# Patient Record
Sex: Female | Born: 1975 | Hispanic: No | Marital: Married | Smoking: Never smoker
Health system: Southern US, Community
[De-identification: ages and names within clinical notes are randomized; demographics above are authoritative.]

## PROBLEM LIST (undated history)

## (undated) DIAGNOSIS — J351 Hypertrophy of tonsils: Secondary | ICD-10-CM

## (undated) DIAGNOSIS — E871 Hypo-osmolality and hyponatremia: Secondary | ICD-10-CM

## (undated) DIAGNOSIS — Z0000 Encounter for general adult medical examination without abnormal findings: Principal | ICD-10-CM

## (undated) DIAGNOSIS — D1730 Benign lipomatous neoplasm of skin and subcutaneous tissue of unspecified sites: Secondary | ICD-10-CM

## (undated) DIAGNOSIS — R739 Hyperglycemia, unspecified: Secondary | ICD-10-CM

## (undated) DIAGNOSIS — D649 Anemia, unspecified: Secondary | ICD-10-CM

## (undated) DIAGNOSIS — B019 Varicella without complication: Secondary | ICD-10-CM

## (undated) DIAGNOSIS — T7840XA Allergy, unspecified, initial encounter: Secondary | ICD-10-CM

## (undated) HISTORY — PX: WISDOM TOOTH EXTRACTION: SHX21

## (undated) HISTORY — DX: Hypo-osmolality and hyponatremia: E87.1

## (undated) HISTORY — DX: Encounter for general adult medical examination without abnormal findings: Z00.00

## (undated) HISTORY — DX: Allergy, unspecified, initial encounter: T78.40XA

## (undated) HISTORY — DX: Hyperglycemia, unspecified: R73.9

## (undated) HISTORY — DX: Anemia, unspecified: D64.9

## (undated) HISTORY — DX: Benign lipomatous neoplasm of skin and subcutaneous tissue of unspecified sites: D17.30

## (undated) HISTORY — DX: Varicella without complication: B01.9

## (undated) HISTORY — DX: Hypertrophy of tonsils: J35.1

---

## 2003-04-29 ENCOUNTER — Other Ambulatory Visit: Admission: RE | Admit: 2003-04-29 | Discharge: 2003-04-29 | Payer: Self-pay | Admitting: Obstetrics and Gynecology

## 2004-06-07 ENCOUNTER — Other Ambulatory Visit: Admission: RE | Admit: 2004-06-07 | Discharge: 2004-06-07 | Payer: Self-pay | Admitting: Obstetrics and Gynecology

## 2005-07-02 ENCOUNTER — Other Ambulatory Visit: Admission: RE | Admit: 2005-07-02 | Discharge: 2005-07-02 | Payer: Self-pay | Admitting: Obstetrics and Gynecology

## 2010-11-06 ENCOUNTER — Emergency Department (HOSPITAL_COMMUNITY)
Admission: EM | Admit: 2010-11-06 | Discharge: 2010-11-06 | Payer: Self-pay | Source: Home / Self Care | Admitting: Emergency Medicine

## 2011-11-14 IMAGING — CR DG FOOT COMPLETE 3+V*R*
3 series · 3 of 3 positions shown · non-contrast
Comparison: None

CLINICAL DATA: Trauma.  Foreign object within bottom of foot.

RIGHT FOOT COMPLETE - 3+ VIEW

[t foot ap right]
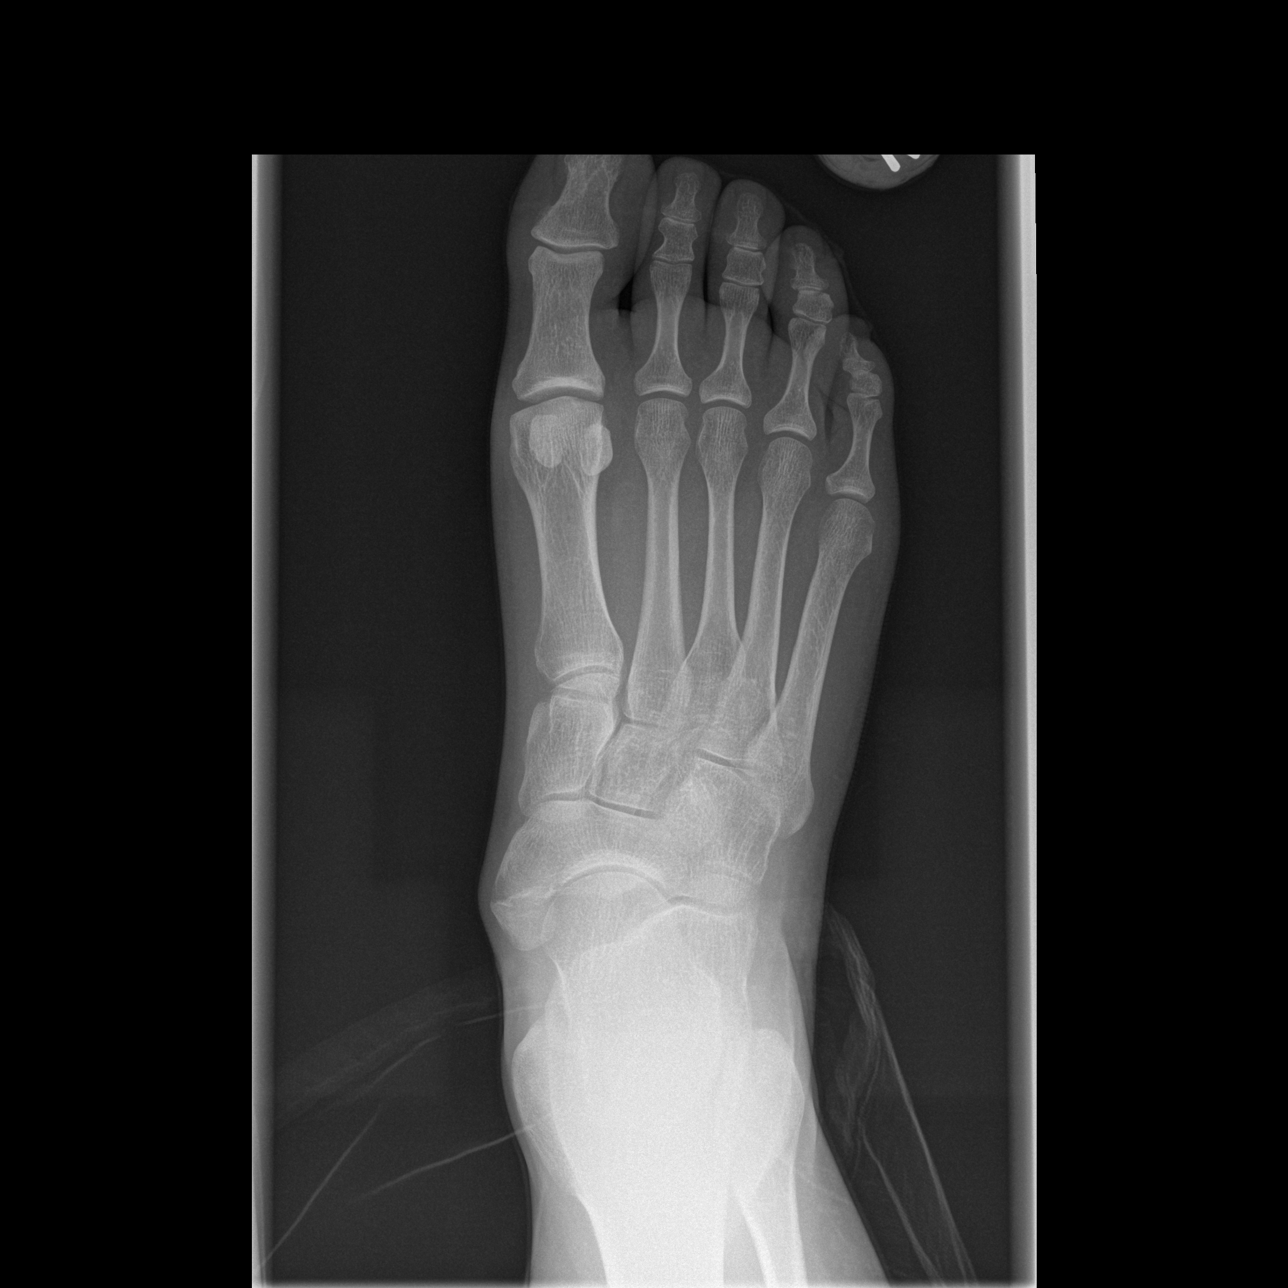

[t foot oblique right]
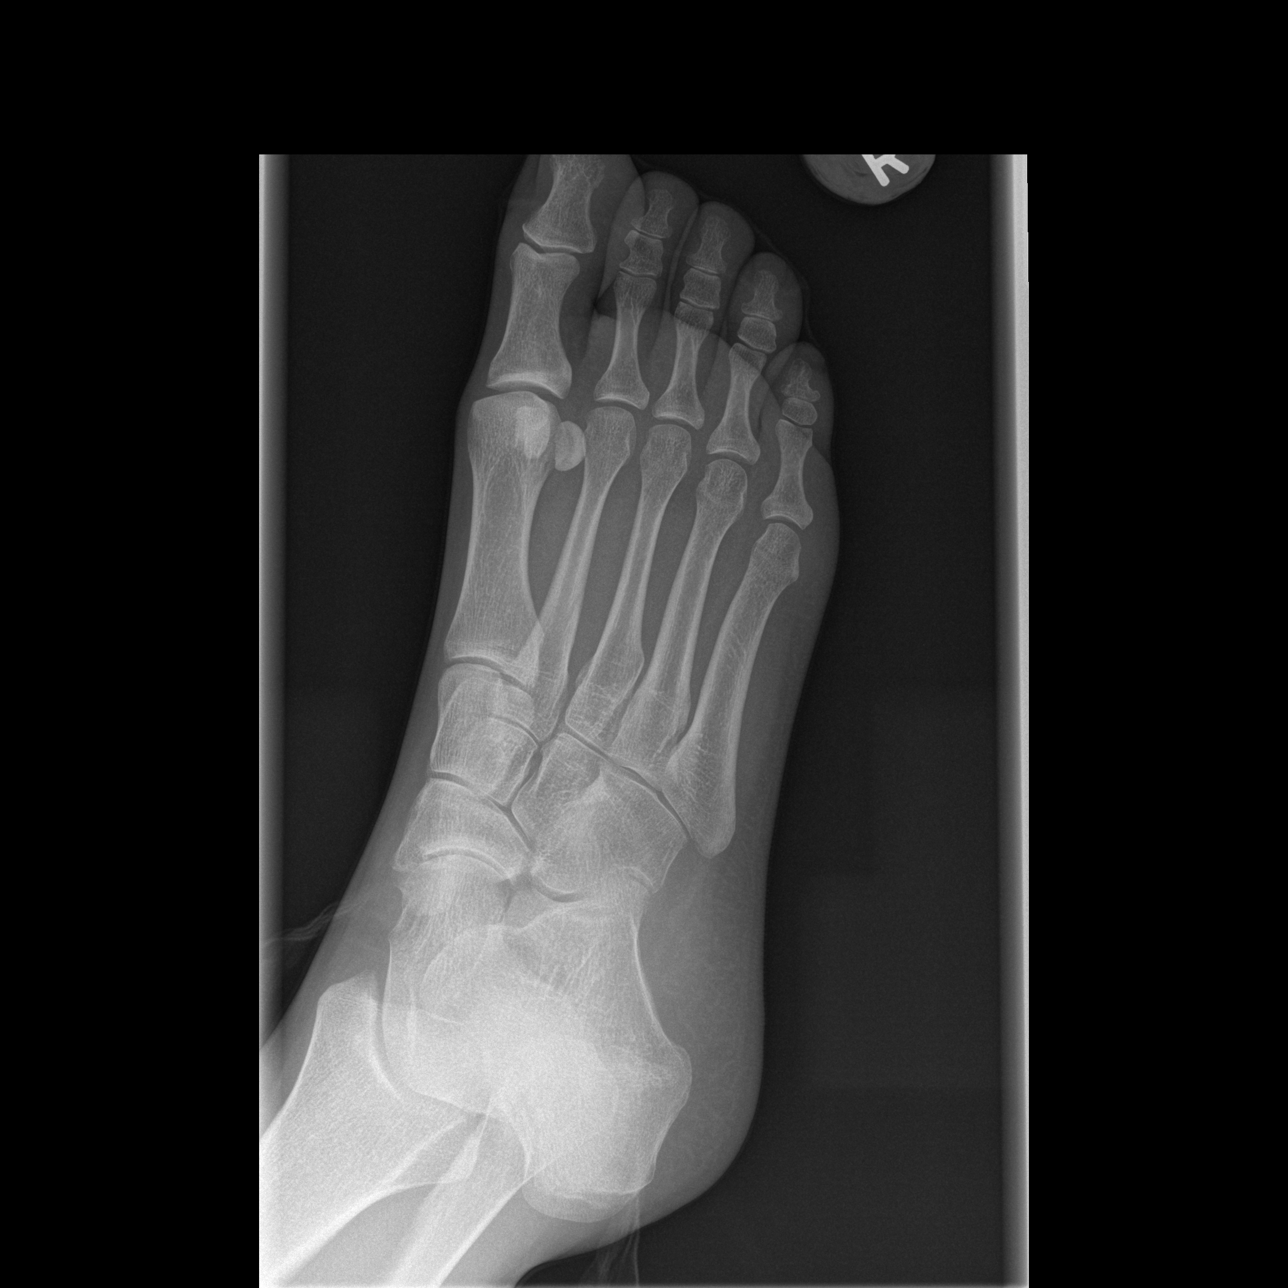

[t foot lat right]
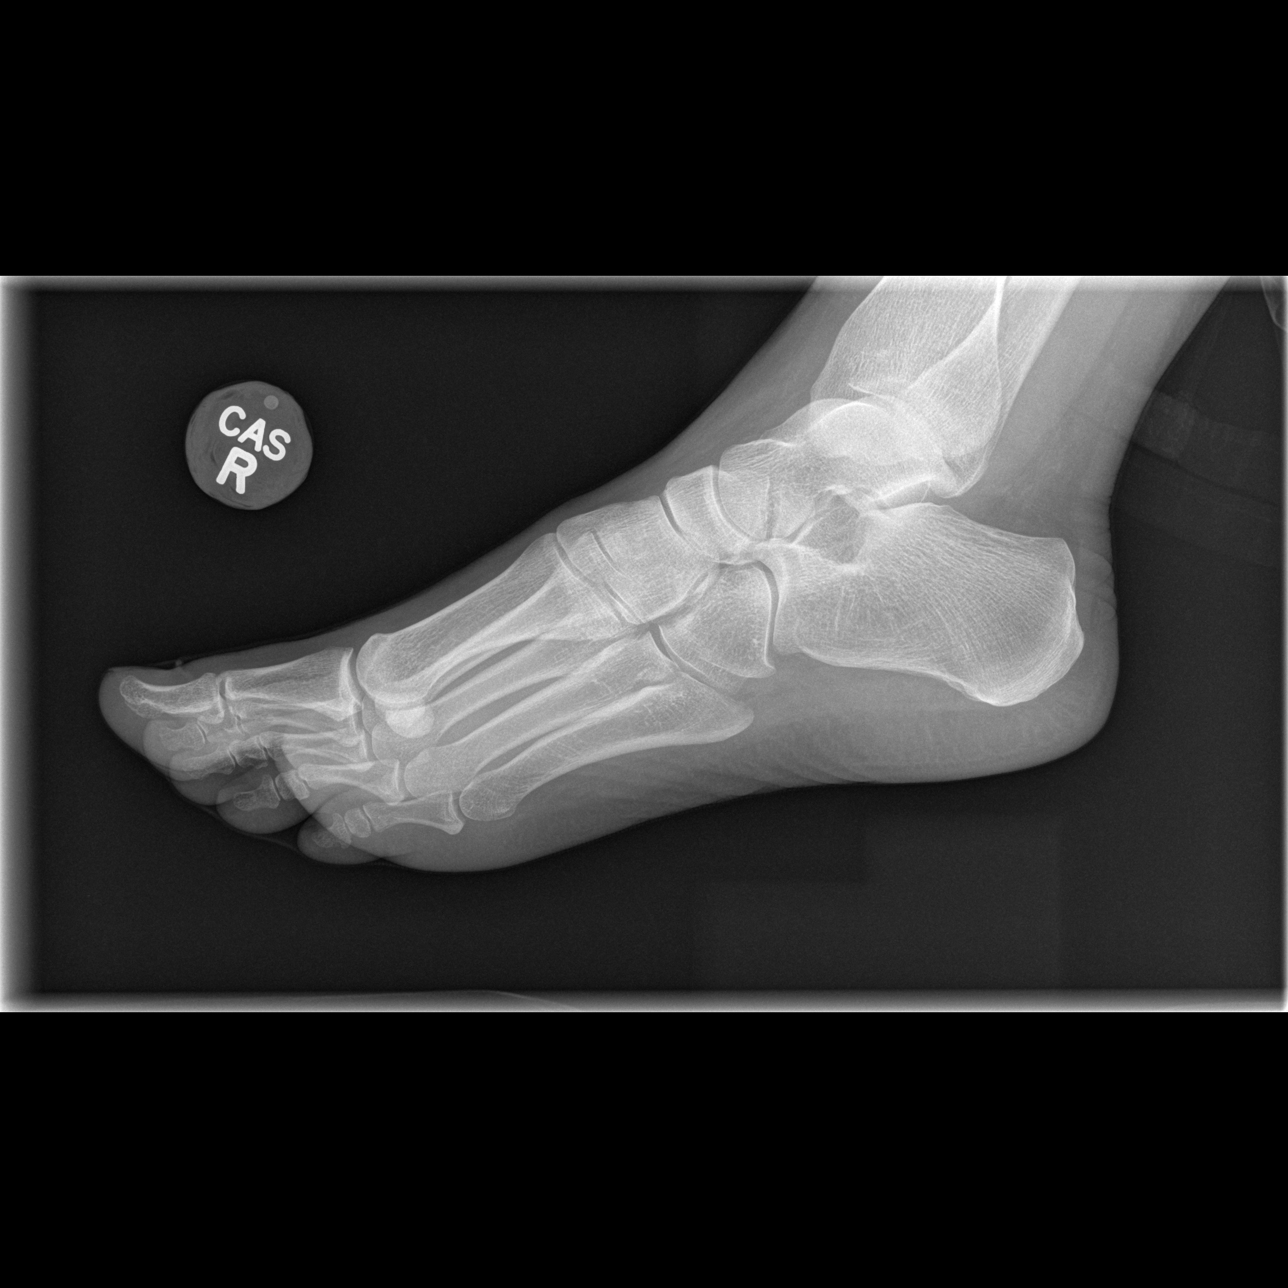

[3 of 3 positions shown; findings below may reference images not displayed]

FINDINGS: There is no evidence of fracture or dislocation.  There is no
evidence of arthropathy or other focal bone abnormality.  Soft
tissues are unremarkable.
IMPRESSION: 1.  No acute findings.
2.  No radiopaque foreign bodies identified.

## 2013-08-17 ENCOUNTER — Other Ambulatory Visit (HOSPITAL_COMMUNITY): Payer: Self-pay

## 2013-08-18 ENCOUNTER — Encounter: Payer: Self-pay | Admitting: Family Medicine

## 2013-08-18 ENCOUNTER — Ambulatory Visit (INDEPENDENT_AMBULATORY_CARE_PROVIDER_SITE_OTHER): Payer: BC Managed Care – PPO | Admitting: Family Medicine

## 2013-08-18 VITALS — BP 118/78 | HR 76 | Temp 99.1°F | Ht 69.0 in | Wt 150.0 lb

## 2013-08-18 DIAGNOSIS — Z23 Encounter for immunization: Secondary | ICD-10-CM

## 2013-08-18 DIAGNOSIS — Z0000 Encounter for general adult medical examination without abnormal findings: Secondary | ICD-10-CM

## 2013-08-18 DIAGNOSIS — J351 Hypertrophy of tonsils: Secondary | ICD-10-CM

## 2013-08-18 DIAGNOSIS — E871 Hypo-osmolality and hyponatremia: Secondary | ICD-10-CM

## 2013-08-18 DIAGNOSIS — T7840XA Allergy, unspecified, initial encounter: Secondary | ICD-10-CM

## 2013-08-18 MED ORDER — FLUTICASONE PROPIONATE 50 MCG/ACT NA SUSP: 1.0000 | Freq: Every day | NASAL | Status: DC

## 2013-08-18 NOTE — Patient Instructions (Addendum)
Preventive Care for Adults, Female A healthy lifestyle and preventive care can promote health and wellness. Preventive health guidelines for women include the following key practices.  A routine yearly physical is a good way to check with your caregiver about your health and preventive screening. It is a chance to share any concerns and updates on your health, and to receive a thorough exam.  Visit your dentist for a routine exam and preventive care every 6 months. Brush your teeth twice a day and floss once a day. Good oral hygiene prevents tooth decay and gum disease.  The frequency of eye exams is based on your age, health, family medical history, use of contact lenses, and other factors. Follow your caregiver's recommendations for frequency of eye exams.  Eat a healthy diet. Foods like vegetables, fruits, whole grains, low-fat dairy products, and lean protein foods contain the nutrients you need without too many calories. Decrease your intake of foods high in solid fats, added sugars, and salt. Eat the right amount of calories for you.Get information about a proper diet from your caregiver, if necessary.  Regular physical exercise is one of the most important things you can do for your health. Most adults should get at least 150 minutes of moderate-intensity exercise (any activity that increases your heart rate and causes you to sweat) each week. In addition, most adults need muscle-strengthening exercises on 2 or more days a week.  Maintain a healthy weight. The body mass index (BMI) is a screening tool to identify possible weight problems. It provides an estimate of body fat based on height and weight. Your caregiver can help determine your BMI, and can help you achieve or maintain a healthy weight.For adults 20 years and older:  A BMI below 18.5 is considered underweight.  A BMI of 18.5 to 24.9 is normal.  A BMI of 25 to 29.9 is considered overweight.  A BMI of 30 and above is  considered obese.  Maintain normal blood lipids and cholesterol levels by exercising and minimizing your intake of saturated fat. Eat a balanced diet with plenty of fruit and vegetables. Blood tests for lipids and cholesterol should begin at age 20 and be repeated every 5 years. If your lipid or cholesterol levels are high, you are over 50, or you are at high risk for heart disease, you may need your cholesterol levels checked more frequently.Ongoing high lipid and cholesterol levels should be treated with medicines if diet and exercise are not effective.  If you smoke, find out from your caregiver how to quit. If you do not use tobacco, do not start.  If you are pregnant, do not drink alcohol. If you are breastfeeding, be very cautious about drinking alcohol. If you are not pregnant and choose to drink alcohol, do not exceed 1 drink per day. One drink is considered to be 12 ounces (355 mL) of beer, 5 ounces (148 mL) of wine, or 1.5 ounces (44 mL) of liquor.  Avoid use of street drugs. Do not share needles with anyone. Ask for help if you need support or instructions about stopping the use of drugs.  High blood pressure causes heart disease and increases the risk of stroke. Your blood pressure should be checked at least every 1 to 2 years. Ongoing high blood pressure should be treated with medicines if weight loss and exercise are not effective.  If you are 55 to 39 years old, ask your caregiver if you should take aspirin to prevent strokes.  Diabetes   screening involves taking a blood sample to check your fasting blood sugar level. This should be done once every 3 years, after age 45, if you are within normal weight and without risk factors for diabetes. Testing should be considered at a younger age or be carried out more frequently if you are overweight and have at least 1 risk factor for diabetes.  Breast cancer screening is essential preventive care for women. You should practice "breast  self-awareness." This means understanding the normal appearance and feel of your breasts and may include breast self-examination. Any changes detected, no matter how small, should be reported to a caregiver. Women in their 20s and 30s should have a clinical breast exam (CBE) by a caregiver as part of a regular health exam every 1 to 3 years. After age 40, women should have a CBE every year. Starting at age 40, women should consider having a mammography (breast X-ray test) every year. Women who have a family history of breast cancer should talk to their caregiver about genetic screening. Women at a high risk of breast cancer should talk to their caregivers about having magnetic resonance imaging (MRI) and a mammography every year.  The Pap test is a screening test for cervical cancer. A Pap test can show cell changes on the cervix that might become cervical cancer if left untreated. A Pap test is a procedure in which cells are obtained and examined from the lower end of the uterus (cervix).  Women should have a Pap test starting at age 21.  Between ages 21 and 29, Pap tests should be repeated every 2 years.  Beginning at age 30, you should have a Pap test every 3 years as long as the past 3 Pap tests have been normal.  Some women have medical problems that increase the chance of getting cervical cancer. Talk to your caregiver about these problems. It is especially important to talk to your caregiver if a new problem develops soon after your last Pap test. In these cases, your caregiver may recommend more frequent screening and Pap tests.  The above recommendations are the same for women who have or have not gotten the vaccine for human papillomavirus (HPV).  If you had a hysterectomy for a problem that was not cancer or a condition that could lead to cancer, then you no longer need Pap tests. Even if you no longer need a Pap test, a regular exam is a good idea to make sure no other problems are  starting.  If you are between ages 65 and 70, and you have had normal Pap tests going back 10 years, you no longer need Pap tests. Even if you no longer need a Pap test, a regular exam is a good idea to make sure no other problems are starting.  If you have had past treatment for cervical cancer or a condition that could lead to cancer, you need Pap tests and screening for cancer for at least 20 years after your treatment.  If Pap tests have been discontinued, risk factors (such as a new sexual partner) need to be reassessed to determine if screening should be resumed.  The HPV test is an additional test that may be used for cervical cancer screening. The HPV test looks for the virus that can cause the cell changes on the cervix. The cells collected during the Pap test can be tested for HPV. The HPV test could be used to screen women aged 30 years and older, and should   be used in women of any age who have unclear Pap test results. After the age of 30, women should have HPV testing at the same frequency as a Pap test.  Colorectal cancer can be detected and often prevented. Most routine colorectal cancer screening begins at the age of 50 and continues through age 75. However, your caregiver may recommend screening at an earlier age if you have risk factors for colon cancer. On a yearly basis, your caregiver may provide home test kits to check for hidden blood in the stool. Use of a small camera at the end of a tube, to directly examine the colon (sigmoidoscopy or colonoscopy), can detect the earliest forms of colorectal cancer. Talk to your caregiver about this at age 50, when routine screening begins. Direct examination of the colon should be repeated every 5 to 10 years through age 75, unless early forms of pre-cancerous polyps or small growths are found.  Hepatitis C blood testing is recommended for all people born from 1945 through 1965 and any individual with known risks for hepatitis C.  Practice  safe sex. Use condoms and avoid high-risk sexual practices to reduce the spread of sexually transmitted infections (STIs). STIs include gonorrhea, chlamydia, syphilis, trichomonas, herpes, HPV, and human immunodeficiency virus (HIV). Herpes, HIV, and HPV are viral illnesses that have no cure. They can result in disability, cancer, and death. Sexually active women aged 25 and younger should be checked for chlamydia. Older women with new or multiple partners should also be tested for chlamydia. Testing for other STIs is recommended if you are sexually active and at increased risk.  Osteoporosis is a disease in which the bones lose minerals and strength with aging. This can result in serious bone fractures. The risk of osteoporosis can be identified using a bone density scan. Women ages 65 and over and women at risk for fractures or osteoporosis should discuss screening with their caregivers. Ask your caregiver whether you should take a calcium supplement or vitamin D to reduce the rate of osteoporosis.  Menopause can be associated with physical symptoms and risks. Hormone replacement therapy is available to decrease symptoms and risks. You should talk to your caregiver about whether hormone replacement therapy is right for you.  Use sunscreen with sun protection factor (SPF) of 30 or more. Apply sunscreen liberally and repeatedly throughout the day. You should seek shade when your shadow is shorter than you. Protect yourself by wearing long sleeves, pants, a wide-brimmed hat, and sunglasses year round, whenever you are outdoors.  Once a month, do a whole body skin exam, using a mirror to look at the skin on your back. Notify your caregiver of new moles, moles that have irregular borders, moles that are larger than a pencil eraser, or moles that have changed in shape or color.  Stay current with required immunizations.  Influenza. You need a dose every fall (or winter). The composition of the flu vaccine  changes each year, so being vaccinated once is not enough.  Pneumococcal polysaccharide. You need 1 to 2 doses if you smoke cigarettes or if you have certain chronic medical conditions. You need 1 dose at age 65 (or older) if you have never been vaccinated.  Tetanus, diphtheria, pertussis (Tdap, Td). Get 1 dose of Tdap vaccine if you are younger than age 65, are over 65 and have contact with an infant, are a healthcare worker, are pregnant, or simply want to be protected from whooping cough. After that, you need a Td   booster dose every 10 years. Consult your caregiver if you have not had at least 3 tetanus and diphtheria-containing shots sometime in your life or have a deep or dirty wound.  HPV. You need this vaccine if you are a woman age 26 or younger. The vaccine is given in 3 doses over 6 months.  Measles, mumps, rubella (MMR). You need at least 1 dose of MMR if you were born in 1957 or later. You may also need a second dose.  Meningococcal. If you are age 19 to 21 and a first-year college student living in a residence hall, or have one of several medical conditions, you need to get vaccinated against meningococcal disease. You may also need additional booster doses.  Zoster (shingles). If you are age 60 or older, you should get this vaccine.  Varicella (chickenpox). If you have never had chickenpox or you were vaccinated but received only 1 dose, talk to your caregiver to find out if you need this vaccine.  Hepatitis A. You need this vaccine if you have a specific risk factor for hepatitis A virus infection or you simply wish to be protected from this disease. The vaccine is usually given as 2 doses, 6 to 18 months apart.  Hepatitis B. You need this vaccine if you have a specific risk factor for hepatitis B virus infection or you simply wish to be protected from this disease. The vaccine is given in 3 doses, usually over 6 months. Preventive Services / Frequency Ages 19 to 39  Blood  pressure check.** / Every 1 to 2 years.  Lipid and cholesterol check.** / Every 5 years beginning at age 20.  Clinical breast exam.** / Every 3 years for women in their 20s and 30s.  Pap test.** / Every 2 years from ages 21 through 29. Every 3 years starting at age 30 through age 65 or 70 with a history of 3 consecutive normal Pap tests.  HPV screening.** / Every 3 years from ages 30 through ages 65 to 70 with a history of 3 consecutive normal Pap tests.  Hepatitis C blood test.** / For any individual with known risks for hepatitis C.  Skin self-exam. / Monthly.  Influenza immunization.** / Every year.  Pneumococcal polysaccharide immunization.** / 1 to 2 doses if you smoke cigarettes or if you have certain chronic medical conditions.  Tetanus, diphtheria, pertussis (Tdap, Td) immunization. / A one-time dose of Tdap vaccine. After that, you need a Td booster dose every 10 years.  HPV immunization. / 3 doses over 6 months, if you are 26 and younger.  Measles, mumps, rubella (MMR) immunization. / You need at least 1 dose of MMR if you were born in 1957 or later. You may also need a second dose.  Meningococcal immunization. / 1 dose if you are age 19 to 21 and a first-year college student living in a residence hall, or have one of several medical conditions, you need to get vaccinated against meningococcal disease. You may also need additional booster doses.  Varicella immunization.** / Consult your caregiver.  Hepatitis A immunization.** / Consult your caregiver. 2 doses, 6 to 18 months apart.  Hepatitis B immunization.** / Consult your caregiver. 3 doses usually over 6 months. Ages 40 to 64  Blood pressure check.** / Every 1 to 2 years.  Lipid and cholesterol check.** / Every 5 years beginning at age 20.  Clinical breast exam.** / Every year after age 40.  Mammogram.** / Every year beginning at age 40   and continuing for as long as you are in good health. Consult with your  caregiver.  Pap test.** / Every 3 years starting at age 30 through age 65 or 70 with a history of 3 consecutive normal Pap tests.  HPV screening.** / Every 3 years from ages 30 through ages 65 to 70 with a history of 3 consecutive normal Pap tests.  Fecal occult blood test (FOBT) of stool. / Every year beginning at age 50 and continuing until age 75. You may not need to do this test if you get a colonoscopy every 10 years.  Flexible sigmoidoscopy or colonoscopy.** / Every 5 years for a flexible sigmoidoscopy or every 10 years for a colonoscopy beginning at age 50 and continuing until age 75.  Hepatitis C blood test.** / For all people born from 1945 through 1965 and any individual with known risks for hepatitis C.  Skin self-exam. / Monthly.  Influenza immunization.** / Every year.  Pneumococcal polysaccharide immunization.** / 1 to 2 doses if you smoke cigarettes or if you have certain chronic medical conditions.  Tetanus, diphtheria, pertussis (Tdap, Td) immunization.** / A one-time dose of Tdap vaccine. After that, you need a Td booster dose every 10 years.  Measles, mumps, rubella (MMR) immunization. / You need at least 1 dose of MMR if you were born in 1957 or later. You may also need a second dose.  Varicella immunization.** / Consult your caregiver.  Meningococcal immunization.** / Consult your caregiver.  Hepatitis A immunization.** / Consult your caregiver. 2 doses, 6 to 18 months apart.  Hepatitis B immunization.** / Consult your caregiver. 3 doses, usually over 6 months. Ages 65 and over  Blood pressure check.** / Every 1 to 2 years.  Lipid and cholesterol check.** / Every 5 years beginning at age 20.  Clinical breast exam.** / Every year after age 40.  Mammogram.** / Every year beginning at age 40 and continuing for as long as you are in good health. Consult with your caregiver.  Pap test.** / Every 3 years starting at age 30 through age 65 or 70 with a 3  consecutive normal Pap tests. Testing can be stopped between 65 and 70 with 3 consecutive normal Pap tests and no abnormal Pap or HPV tests in the past 10 years.  HPV screening.** / Every 3 years from ages 30 through ages 65 or 70 with a history of 3 consecutive normal Pap tests. Testing can be stopped between 65 and 70 with 3 consecutive normal Pap tests and no abnormal Pap or HPV tests in the past 10 years.  Fecal occult blood test (FOBT) of stool. / Every year beginning at age 50 and continuing until age 75. You may not need to do this test if you get a colonoscopy every 10 years.  Flexible sigmoidoscopy or colonoscopy.** / Every 5 years for a flexible sigmoidoscopy or every 10 years for a colonoscopy beginning at age 50 and continuing until age 75.  Hepatitis C blood test.** / For all people born from 1945 through 1965 and any individual with known risks for hepatitis C.  Osteoporosis screening.** / A one-time screening for women ages 65 and over and women at risk for fractures or osteoporosis.  Skin self-exam. / Monthly.  Influenza immunization.** / Every year.  Pneumococcal polysaccharide immunization.** / 1 dose at age 65 (or older) if you have never been vaccinated.  Tetanus, diphtheria, pertussis (Tdap, Td) immunization. / A one-time dose of Tdap vaccine if you are over   65 and have contact with an infant, are a healthcare worker, or simply want to be protected from whooping cough. After that, you need a Td booster dose every 10 years.  Varicella immunization.** / Consult your caregiver.  Meningococcal immunization.** / Consult your caregiver.  Hepatitis A immunization.** / Consult your caregiver. 2 doses, 6 to 18 months apart.  Hepatitis B immunization.** / Check with your caregiver. 3 doses, usually over 6 months. ** Family history and personal history of risk and conditions may change your caregiver's recommendations. Document Released: 01/07/2002 Document Revised: 02/03/2012  Document Reviewed: 04/08/2011 [employer] Patient Information 2014 [employer].  

## 2013-08-20 LAB — RENAL FUNCTION PANEL
BUN: 12 mg/dL (ref 6–23)
CO2: 26 mEq/L (ref 19–32)
Chloride: 102 mEq/L (ref 96–112)
Glucose, Bld: 107 mg/dL — ABNORMAL HIGH (ref 70–99)
Potassium: 4.3 mEq/L (ref 3.5–5.3)

## 2013-08-20 LAB — HEPATIC FUNCTION PANEL
ALT: 10 U/L (ref 0–35)
AST: 15 U/L (ref 0–37)
Alkaline Phosphatase: 37 U/L — ABNORMAL LOW (ref 39–117)
Bilirubin, Direct: 0.1 mg/dL (ref 0.0–0.3)
Indirect Bilirubin: 0.2 mg/dL (ref 0.0–0.9)

## 2013-08-20 LAB — LIPID PANEL
LDL Cholesterol: 89 mg/dL (ref 0–99)
Triglycerides: 96 mg/dL (ref <150)
VLDL: 19 mg/dL (ref 0–40)

## 2013-08-22 ENCOUNTER — Encounter: Payer: Self-pay | Admitting: Family Medicine

## 2013-08-22 DIAGNOSIS — J351 Hypertrophy of tonsils: Secondary | ICD-10-CM

## 2013-08-22 DIAGNOSIS — E871 Hypo-osmolality and hyponatremia: Secondary | ICD-10-CM | POA: Insufficient documentation

## 2013-08-22 DIAGNOSIS — Z0000 Encounter for general adult medical examination without abnormal findings: Secondary | ICD-10-CM

## 2013-08-22 HISTORY — DX: Encounter for general adult medical examination without abnormal findings: Z00.00

## 2013-08-22 HISTORY — DX: Hypo-osmolality and hyponatremia: E87.1

## 2013-08-22 HISTORY — DX: Hypertrophy of tonsils: J35.1

## 2013-08-22 NOTE — Assessment & Plan Note (Signed)
Give flu shot today. Annual labs drawn today. Encouraged DASH diet and regular exercise.

## 2013-08-22 NOTE — Progress Notes (Signed)
Patient ID: Faythe Casa, female   DOB: 01-20-1976, 37 y.o.   MRN: [id number] ABIGALE DOROW [id number] [birth year] 08/22/2013      Progress Note-Follow Up  Subjective  Chief Complaint  Chief Complaint  Patient presents with  . Establish Care    new patient  . Injections    flu    HPI  Patient is a 37 year old Caucasian female who is here today to establish care. She has not had a primary care doctor in many years. Has followed with [facility] Dr. Tenny Craw for many years. Generally is in good health but does offer  A fe edema in her legs after being on her feet for long time. Some allergies her mildly controlled on antihistamines but she still having breakthrough symptoms. Finally she has a long history of enlarged tonsils dating back to her current illness when she was younger. Does occasionally have a mild sense of dysphasia oaccasionally. No shortness of breath, chest pain or palpitations. No recent illness or SOB. NO GI or GU c/o  Past Medical History  Diagnosis Date  . Chicken pox as a kid  . Allergy   . Allergic state   . Enlarged tonsils 08/22/2013  . Hyponatremia 08/22/2013  . Preventative health care 08/22/2013    Past Surgical History  Procedure Laterality Date  . Wisdom tooth extraction  37 yrs old    Family History  Problem Relation Age of Onset  . Thyroid disease Mother   . Cancer Mother     uterine,   . Diabetes Mother     hyperglycemia  . Hypertension Father   . Other Paternal Grandmother     brain tumor  . Diabetes Maternal Aunt     History   Social History  . Marital Status: Married    Spouse Name: N/A    Number of Children: N/A  . Years of Education: N/A   Occupational History  . Not on file.   Social History Main Topics  . Smoking status: Never Smoker   . Smokeless tobacco: Never Used  . Alcohol Use: Yes     Comment: 2 glasses of wine daily  . Drug Use: No  . Sexual Activity: Yes   Other Topics Concern  . Not on file   Social  History Narrative  . No narrative on file    No current outpatient prescriptions on file prior to visit.   No current facility-administered medications on file prior to visit.    Allergies  Allergen Reactions  . Ciprofloxacin     swelling    Review of Systems  Review of Systems  Constitutional: Negative for fever and malaise/fatigue.  HENT: Negative for congestion.   Eyes: Negative for discharge.  Respiratory: Negative for shortness of breath.   Cardiovascular: Negative for chest pain, palpitations and leg swelling.  Gastrointestinal: Negative for nausea, abdominal pain and diarrhea.  Genitourinary: Negative for dysuria.  Musculoskeletal: Negative for falls.  Skin: Negative for rash.  Neurological: Negative for loss of consciousness and headaches.  Endo/Heme/Allergies: Negative for polydipsia.  Psychiatric/Behavioral: Negative for depression and suicidal ideas. The patient is not nervous/anxious and does not have insomnia.     Objective  BP 118/78  Pulse 76  Temp(Src) 99.1 F (37.3 C) (Oral)  Ht 5\' 9"  (1.753 m)  Wt 150 lb (68.04 kg)  BMI 22.14 kg/m2  SpO2 97%  LMP 08/03/2013  Physical Exam  Physical Exam  Constitutional: She is oriented to person, place, and time and well-developed,  well-nourished, and in no distress. No distress.  HENT:  Head: Normocephalic and atraumatic.  Eyes: Conjunctivae are normal.  Neck: Neck supple. No thyromegaly present.  Cardiovascular: Normal rate, regular rhythm and normal heart sounds.   No murmur heard. Pulmonary/Chest: Effort normal and breath sounds normal. She has no wheezes.  Abdominal: She exhibits no distension and no mass.  Musculoskeletal: She exhibits no edema.  Lymphadenopathy:    She has no cervical adenopathy.  Neurological: She is alert and oriented to person, place, and time.  Skin: Skin is warm and dry. No rash noted. She is not diaphoretic.  Psychiatric: Memory, affect and judgment normal.    Lab Results   Component Value Date   TSH 2.841 08/18/2013   No results found for this basename: WBC, HGB, HCT, MCV, PLT   Lab Results  Component Value Date   CREATININE 0.80 08/18/2013   BUN 12 08/18/2013   NA 134* 08/18/2013   K 4.3 08/18/2013   CL 102 08/18/2013   CO2 26 08/18/2013   Lab Results  Component Value Date   ALT 10 08/18/2013   AST 15 08/18/2013   ALKPHOS 37* 08/18/2013   BILITOT 0.3 08/18/2013   Lab Results  Component Value Date   CHOL 186 08/18/2013   Lab Results  Component Value Date   HDL 78 08/18/2013   Lab Results  Component Value Date   LDLCALC 89 08/18/2013   Lab Results  Component Value Date   TRIG 96 08/18/2013   Lab Results  Component Value Date   CHOLHDL 2.4 08/18/2013     Assessment & Plan  Hyponatremia Mild will monitor  Enlarged tonsils Mild dysphagia on occasion, trouble off and on for years.  Consider referral to ENT if symptoms worsen  Preventative health care Give flu shot today. Annual labs drawn today. Encouraged DASH diet and regular exercise.  Allergic state Continue Zyrtec daily and add Flonase daily

## 2013-08-22 NOTE — Assessment & Plan Note (Signed)
Mild dysphagia on occasion, trouble off and on for years.  Consider referral to ENT if symptoms worsen

## 2013-08-22 NOTE — Assessment & Plan Note (Signed)
Mild - will monitor 

## 2013-08-22 NOTE — Assessment & Plan Note (Signed)
Continue Zyrtec daily and add Flonase daily

## 2013-09-02 ENCOUNTER — Encounter: Payer: Self-pay | Admitting: Family Medicine

## 2013-09-03 MED ORDER — HYDROCHLOROTHIAZIDE 12.5 MG PO CAPS: 12.5000 mg | ORAL_CAPSULE | Freq: Every day | ORAL | Status: DC | PRN

## 2013-09-03 NOTE — Telephone Encounter (Signed)
Detailed message left for pt and rx sent

## 2013-09-25 LAB — HM PAP SMEAR: HM PAP: NORMAL

## 2013-12-07 ENCOUNTER — Other Ambulatory Visit: Payer: Self-pay | Admitting: Family Medicine

## 2014-02-03 ENCOUNTER — Ambulatory Visit (INDEPENDENT_AMBULATORY_CARE_PROVIDER_SITE_OTHER): Payer: BC Managed Care – PPO | Admitting: Physician Assistant

## 2014-02-03 ENCOUNTER — Encounter: Payer: Self-pay | Admitting: Physician Assistant

## 2014-02-03 VITALS — BP 116/78 | HR 85 | Temp 97.8°F | Resp 16 | Ht 69.0 in | Wt 152.0 lb

## 2014-02-03 DIAGNOSIS — J329 Chronic sinusitis, unspecified: Secondary | ICD-10-CM

## 2014-02-03 MED ORDER — AZITHROMYCIN 250 MG PO TABS: ORAL_TABLET | ORAL | Status: DC

## 2014-02-03 NOTE — Progress Notes (Signed)
Pre visit review using our clinic review tool, if applicable. No additional management support is needed unless otherwise documented below in the visit note/SLS  

## 2014-02-03 NOTE — Patient Instructions (Signed)
Take antibiotic as prescribed.  Increase fluid intake.  Rest.  Saline nasal spray. Mucinex. Humidifier in bedroom. Delsym for cough. Take Flonase and Zyrtec daily. Please call or return to clinic if symptoms are not improving.  Sinusitis Sinusitis is redness, soreness, and swelling (inflammation) of the paranasal sinuses. Paranasal sinuses are air pockets within the bones of your face (beneath the eyes, the middle of the forehead, or above the eyes). In healthy paranasal sinuses, mucus is able to drain out, and air is able to circulate through them by way of your nose. However, when your paranasal sinuses are inflamed, mucus and air can become trapped. This can allow bacteria and other germs to grow and cause infection. Sinusitis can develop quickly and last only a short time (acute) or continue over a long period (chronic). Sinusitis that lasts for more than 12 weeks is considered chronic.  CAUSES  Causes of sinusitis include:  Allergies.  Structural abnormalities, such as displacement of the cartilage that separates your nostrils (deviated septum), which can decrease the air flow through your nose and sinuses and affect sinus drainage.  Functional abnormalities, such as when the small hairs (cilia) that line your sinuses and help remove mucus do not work properly or are not present. SYMPTOMS  Symptoms of acute and chronic sinusitis are the same. The primary symptoms are pain and pressure around the affected sinuses. Other symptoms include:  Upper toothache.  Earache.  Headache.  Bad breath.  Decreased sense of smell and taste.  A cough, which worsens when you are lying flat.  Fatigue.  Fever.  Thick drainage from your nose, which often is green and may contain pus (purulent).  Swelling and warmth over the affected sinuses. DIAGNOSIS  Your caregiver will perform a physical exam. During the exam, your caregiver may:  Look in your nose for signs of abnormal growths in your  nostrils (nasal polyps).  Tap over the affected sinus to check for signs of infection.  View the inside of your sinuses (endoscopy) with a special imaging device with a light attached (endoscope), which is inserted into your sinuses. If your caregiver suspects that you have chronic sinusitis, one or more of the following tests may be recommended:  Allergy tests.  Nasal culture A sample of mucus is taken from your nose and sent to a lab and screened for bacteria.  Nasal cytology A sample of mucus is taken from your nose and examined by your caregiver to determine if your sinusitis is related to an allergy. TREATMENT  Most cases of acute sinusitis are related to a viral infection and will resolve on their own within 10 days. Sometimes medicines are prescribed to help relieve symptoms (pain medicine, decongestants, nasal steroid sprays, or saline sprays).  However, for sinusitis related to a bacterial infection, your caregiver will prescribe antibiotic medicines. These are medicines that will help kill the bacteria causing the infection.  Rarely, sinusitis is caused by a fungal infection. In theses cases, your caregiver will prescribe antifungal medicine. For some cases of chronic sinusitis, surgery is needed. Generally, these are cases in which sinusitis recurs more than 3 times per year, despite other treatments. HOME CARE INSTRUCTIONS   Drink plenty of water. Water helps thin the mucus so your sinuses can drain more easily.  Use a humidifier.  Inhale steam 3 to 4 times a day (for example, sit in the bathroom with the shower running).  Apply a warm, moist washcloth to your face 3 to 4 times a day,  or as directed by your caregiver.  Use saline nasal sprays to help moisten and clean your sinuses.  Take over-the-counter or prescription medicines for pain, discomfort, or fever only as directed by your caregiver. SEEK IMMEDIATE MEDICAL CARE IF:  You have increasing pain or severe  headaches.  You have nausea, vomiting, or drowsiness.  You have swelling around your face.  You have vision problems.  You have a stiff neck.  You have difficulty breathing. MAKE SURE YOU:   Understand these instructions.  Will watch your condition.  Will get help right away if you are not doing well or get worse. Document Released: 11/11/2005 Document Revised: 02/03/2012 Document Reviewed: 11/26/2011 [facility] Patient Information 2014 [place], [place].

## 2014-02-06 DIAGNOSIS — J329 Chronic sinusitis, unspecified: Secondary | ICD-10-CM | POA: Insufficient documentation

## 2014-02-06 NOTE — Progress Notes (Signed)
Patient presents to clinic today c/o one-week of sinus pressure, sinus pain, ear pressure, postnasal drip, sore throat and nonproductive cough. Patient denies tooth pain, fever, shortness of breath or wheezing. Denies recent travel or sick contact. Patient states her symptoms are worsening.  Past Medical History  Diagnosis Date  . Chicken pox as a kid  . Allergy   . Allergic state   . Enlarged tonsils 08/22/2013  . Hyponatremia 08/22/2013  . Preventative health care 08/22/2013    Current Outpatient Prescriptions on File Prior to Visit  Medication Sig Dispense Refill  . cetirizine (ZYRTEC) 10 MG tablet Take 10 mg by mouth daily.      . fluticasone (FLONASE) 50 MCG/ACT nasal spray PLACE 1 SPRAY INTO THE NOSE DAILY.  16 g  8  . norethindrone-ethinyl estradiol (JUNEL FE,GILDESS FE,LOESTRIN FE) 1-20 MG-MCG tablet Take 1 tablet by mouth daily.       No current facility-administered medications on file prior to visit.    Allergies  Allergen Reactions  . Ciprofloxacin     swelling    Family History  Problem Relation Age of Onset  . Thyroid disease Mother   . Cancer Mother     uterine,   . Diabetes Mother     hyperglycemia  . Hypertension Father   . Other Paternal Grandmother     brain tumor  . Diabetes Maternal Aunt     History   Social History  . Marital Status: Married    Spouse Name: N/A    Number of Children: N/A  . Years of Education: N/A   Social History Main Topics  . Smoking status: Never Smoker   . Smokeless tobacco: Never Used  . Alcohol Use: Yes     Comment: 2 glasses of wine daily  . Drug Use: No  . Sexual Activity: Yes   Other Topics Concern  . None   Social History Narrative  . None   Review of Systems - See HPI.  All other ROS are negative.  BP 116/78  Pulse 85  Temp(Src) 97.8 F (36.6 C) (Oral)  Resp 16  Ht 5' 9"  (1.753 m)  Wt 152 lb (68.947 kg)  BMI 22.44 kg/m2  SpO2 97%  LMP 01/11/2014  Physical Exam  Vitals reviewed. Constitutional:  She is well-developed, well-nourished, and in no distress.  HENT:  Head: Normocephalic and atraumatic.  Right Ear: External ear normal.  Left Ear: External ear normal.  Nose: Nose normal.  Mouth/Throat: Oropharynx is clear and moist. No oropharyngeal exudate.  Tympanic membranes within normal limits bilaterally. Tenderness to percussion of sinuses noted on examination.  Eyes: Conjunctivae are normal. Pupils are equal, round, and reactive to light.  Neck: Neck supple.  Cardiovascular: Normal rate, regular rhythm, normal heart sounds and intact distal pulses.   Pulmonary/Chest: Effort normal and breath sounds normal. No respiratory distress. She has no wheezes. She has no rales. She exhibits no tenderness.  Neurological: She is alert.  Skin: Skin is warm and dry. No rash noted.  Psychiatric: Affect normal.    No results found for this or any previous visit (from the past 2160 hour(s)).  Assessment/Plan: Sinusitis Rx azithromycin. Increase fluid intake. Rest. Saline nasal spray. Flonase. Daily Zyrtec. Mucinex. Probiotic. Humidifier in bedroom.

## 2014-02-06 NOTE — Assessment & Plan Note (Signed)
Rx azithromycin. Increase fluid intake. Rest. Saline nasal spray. Flonase. Daily Zyrtec. Mucinex. Probiotic. Humidifier in bedroom.

## 2014-02-14 ENCOUNTER — Telehealth: Payer: Self-pay | Admitting: *Deleted

## 2014-02-14 NOTE — Telephone Encounter (Signed)
Left message for pt to call me back. Message was taken 02/08/14. Wanted to see if pt's cough was improved before we send cough med.   Please send her n Tessalon Perles 200 mg po bid prn cough, disp #30, thx  Dr Kingsley Callander Mychart After Visit Questionnaire    Question 02/08/2014 8:30 AM   How are you feeling after your recent visit? OK, not completely better   Does the recommended course of treatment seem to be helping your symptoms? Yes, but taking a while   Are you experiencing any side effects from your recommended treatment? No   is there anything else you would like to ask your physician? Could I get a daytime cough syrup? My doctor friend suggested Tessalon (probably spelling that wrong).         Message

## 2014-02-15 MED ORDER — BENZONATATE 200 MG PO CAPS: 200.0000 mg | ORAL_CAPSULE | Freq: Two times a day (BID) | ORAL | Status: DC | PRN

## 2014-02-15 NOTE — Telephone Encounter (Signed)
Pt called back and states that she still has her cough and would like the Tessalon sent to the pharmacy downstairs.  RX sent

## 2014-04-26 ENCOUNTER — Encounter: Payer: Self-pay | Admitting: Physician Assistant

## 2014-04-26 ENCOUNTER — Ambulatory Visit (INDEPENDENT_AMBULATORY_CARE_PROVIDER_SITE_OTHER): Payer: BC Managed Care – PPO | Admitting: Physician Assistant

## 2014-04-26 VITALS — BP 104/70 | HR 92 | Temp 98.8°F | Ht 69.0 in | Wt 149.0 lb

## 2014-04-26 DIAGNOSIS — M549 Dorsalgia, unspecified: Secondary | ICD-10-CM

## 2014-04-26 NOTE — Patient Instructions (Signed)
Please take Celebrex once daily over the next few days.  Then resume alternating tylenol and ibuprofen if needed.  Apply a heating pad to area in 15-minute intervals.  Avoid heavy lifting or overexertion.  Please call or return to clinic if symptoms are not improving.

## 2014-04-26 NOTE — Progress Notes (Signed)
Pre visit review using our clinic review tool, if applicable. No additional management support is needed unless otherwise documented below in the visit note. 

## 2014-04-26 NOTE — Progress Notes (Signed)
Patient presents to clinic today c/o one week of improving back pain after lifting a heavy object at work. Patient denies known trauma or injury. Pain is mostly right sided of mid to lower back. It is exacerbated by range of motion. Denies numbness, tingling or weakness of extremities. Denies radiation of pain. Has taken some ibuprofen with some relief of symptoms.  Past Medical History  Diagnosis Date  . Chicken pox as a kid  . Allergy   . Allergic state   . Enlarged tonsils 08/22/2013  . Hyponatremia 08/22/2013  . Preventative health care 08/22/2013    Current Outpatient Prescriptions on File Prior to Visit  Medication Sig Dispense Refill  . cetirizine (ZYRTEC) 10 MG tablet Take 10 mg by mouth daily.      . fluticasone (FLONASE) 50 MCG/ACT nasal spray PLACE 1 SPRAY INTO THE NOSE DAILY.  16 g  8  . norethindrone-ethinyl estradiol (JUNEL FE,GILDESS FE,LOESTRIN FE) 1-20 MG-MCG tablet Take 1 tablet by mouth daily.       No current facility-administered medications on file prior to visit.    Allergies  Allergen Reactions  . Ciprofloxacin     swelling    Family History  Problem Relation Age of Onset  . Thyroid disease Mother   . Cancer Mother     uterine,   . Diabetes Mother     hyperglycemia  . Hypertension Father   . Other Paternal Grandmother     brain tumor  . Diabetes Maternal Aunt     History   Social History  . Marital Status: Married    Spouse Name: N/A    Number of Children: N/A  . Years of Education: N/A   Social History Main Topics  . Smoking status: Never Smoker   . Smokeless tobacco: Never Used  . Alcohol Use: Yes     Comment: 2 glasses of wine daily  . Drug Use: No  . Sexual Activity: Yes   Other Topics Concern  . None   Social History Narrative  . None   Review of Systems - See HPI.  All other ROS are negative.  BP 104/70  Pulse 92  Temp(Src) 98.8 F (37.1 C) (Oral)  Ht 5' 9"  (1.753 m)  Wt 149 lb (67.586 kg)  BMI 21.99 kg/m2  SpO2  98%  Physical Exam  Constitutional: She is oriented to person, place, and time and well-developed, well-nourished, and in no distress.  HENT:  Head: Normocephalic and atraumatic.  Eyes: Conjunctivae are normal. Pupils are equal, round, and reactive to light.  Neck: Neck supple.  Cardiovascular: Normal rate, regular rhythm, normal heart sounds and intact distal pulses.   Pulmonary/Chest: Effort normal and breath sounds normal. No respiratory distress. She has no wheezes. She has no rales. She exhibits no tenderness.  Musculoskeletal:       Thoracic back: She exhibits tenderness and pain. She exhibits no bony tenderness.       Lumbar back: She exhibits tenderness and pain. She exhibits no bony tenderness.  Pain is elicited with forward flexion at torso. Range of motion is otherwise within normal limits.  Neurological: She is alert and oriented to person, place, and time.  Skin: Skin is warm and dry.  Psychiatric: Affect normal.   Assessment/Plan: Back pain Mostly muscular in nature. Given sample of Celebrex to take once a day over the next 3 days. Then resume alternating between Tylenol and ibuprofen, if needed for pain. Topical Aspercreme. Heating pad to area in 15 minute intervals.  Call or return to clinic if symptoms not improving.

## 2014-04-26 NOTE — Assessment & Plan Note (Signed)
Mostly muscular in nature. Given sample of Celebrex to take once a day over the next 3 days. Then resume alternating between Tylenol and ibuprofen, if needed for pain. Topical Aspercreme. Heating pad to area in 15 minute intervals. Call or return to clinic if symptoms not improving.

## 2014-08-23 ENCOUNTER — Encounter: Payer: BC Managed Care – PPO | Admitting: Family Medicine

## 2014-08-29 ENCOUNTER — Encounter: Payer: Self-pay | Admitting: Family Medicine

## 2014-08-29 ENCOUNTER — Ambulatory Visit (INDEPENDENT_AMBULATORY_CARE_PROVIDER_SITE_OTHER): Payer: BC Managed Care – PPO | Admitting: Family Medicine

## 2014-08-29 VITALS — BP 111/68 | HR 66 | Temp 98.6°F | Ht 69.0 in | Wt 149.4 lb

## 2014-08-29 DIAGNOSIS — Z0000 Encounter for general adult medical examination without abnormal findings: Secondary | ICD-10-CM

## 2014-08-29 DIAGNOSIS — Z23 Encounter for immunization: Secondary | ICD-10-CM

## 2014-08-29 DIAGNOSIS — D1730 Benign lipomatous neoplasm of skin and subcutaneous tissue of unspecified sites: Secondary | ICD-10-CM

## 2014-08-29 NOTE — Progress Notes (Signed)
Pre visit review using our clinic review tool, if applicable. No additional management support is needed unless otherwise documented below in the visit note. 

## 2014-08-29 NOTE — Patient Instructions (Signed)
Try Salon Pas patches or gel as needed apply to lesion on shin twice daily call if does not resolve   Ganglion Cyst A ganglion cyst is a noncancerous, fluid-filled lump that occurs near joints or tendons. The ganglion cyst grows out of a joint or the lining of a tendon. It most often develops in the hand or wrist but can also develop in the shoulder, elbow, hip, knee, ankle, or foot. The round or oval ganglion can be pea sized or larger than a grape. Increased activity may enlarge the size of the cyst because more fluid starts to build up.  CAUSES  It is not completely known what causes a ganglion cyst to grow. However, it may be related to:  Inflammation or irritation around the joint.  An injury.  Repetitive movements or overuse.  Arthritis. SYMPTOMS  A lump most often appears in the hand or wrist, but can occur in other areas of the body. Generally, the lump is painless without other symptoms. However, sometimes pain can be felt during activity or when pressure is applied to the lump. The lump may even be tender to the touch. Tingling, pain, numbness, or muscle weakness can occur if the ganglion cyst presses on a nerve. Your grip may be weak and you may have less movement in your joints.  DIAGNOSIS  Ganglion cysts are most often diagnosed based on a physical exam, noting where the cyst is and how it looks. Your caregiver will feel the lump and may shine a light alongside it. If it is a ganglion, a light often shines through it. Your caregiver may order an X-ray, ultrasound, or MRI to rule out other conditions. TREATMENT  Ganglions usually go away on their own without treatment. If pain or other symptoms are involved, treatment may be needed. Treatment is also needed if the ganglion limits your movement or if it gets infected. Treatment options include:  Wearing a wrist or finger brace or splint.  Taking anti-inflammatory medicine.  Draining fluid from the lump with a needle  (aspiration).  Injecting a steroid into the joint.  Surgery to remove the ganglion cyst and its stalk that is attached to the joint or tendon. However, ganglion cysts can grow back. HOME CARE INSTRUCTIONS   Do not press on the ganglion, poke it with a needle, or hit it with a heavy object. You may rub the lump gently and often. Sometimes fluid moves out of the cyst.  Only take medicines as directed by your caregiver.  Wear your brace or splint as directed by your caregiver. SEEK MEDICAL CARE IF:   Your ganglion becomes larger or more painful.  You have increased redness, red streaks, or swelling.  You have pus coming from the lump.  You have weakness or numbness in the affected area. MAKE SURE YOU:   Understand these instructions.  Will watch your condition.  Will get help right away if you are not doing well or get worse. Document Released: 11/08/2000 Document Revised: 08/05/2012 Document Reviewed: 01/05/2008 [facility] Patient Information 2015 [place], [place]. This information is not intended to replace advice given to you by your health care provider. Make sure you discuss any questions you have with your health care provider.

## 2014-08-30 LAB — RENAL FUNCTION PANEL
Albumin: 4.2 g/dL (ref 3.5–5.2)
BUN: 11 mg/dL (ref 6–23)
CHLORIDE: 102 meq/L (ref 96–112)
CO2: 26 mEq/L (ref 19–32)
Calcium: 9.4 mg/dL (ref 8.4–10.5)
Creatinine, Ser: 0.8 mg/dL (ref 0.4–1.2)
GFR: 90.9 mL/min (ref >60.00)
GLUCOSE: 85 mg/dL (ref 70–99)
PHOSPHORUS: 3.2 mg/dL (ref 2.3–4.6)
POTASSIUM: 3.8 meq/L (ref 3.5–5.1)
SODIUM: 135 meq/L (ref 135–145)

## 2014-08-30 LAB — LIPID PANEL
CHOLESTEROL: 188 mg/dL (ref 0–200)
HDL: 80.1 mg/dL (ref >39.00)
LDL CALC: 93 mg/dL (ref 0–99)
NONHDL: 107.9
Total CHOL/HDL Ratio: 2
Triglycerides: 74 mg/dL (ref 0.0–149.0)
VLDL: 14.8 mg/dL (ref 0.0–40.0)

## 2014-08-30 LAB — CBC
HCT: 34.1 % — ABNORMAL LOW (ref 36.0–46.0)
Hemoglobin: 11.6 g/dL — ABNORMAL LOW (ref 12.0–15.0)
MCHC: 34 g/dL (ref 30.0–36.0)
MCV: 96 fl (ref 78.0–100.0)
PLATELETS: 315 10*3/uL (ref 150.0–400.0)
RBC: 3.55 Mil/uL — AB (ref 3.87–5.11)
RDW: 13.6 % (ref 11.5–15.5)
WBC: 7.3 10*3/uL (ref 4.0–10.5)

## 2014-08-30 LAB — HEPATIC FUNCTION PANEL
ALBUMIN: 4.2 g/dL (ref 3.5–5.2)
ALK PHOS: 33 U/L — AB (ref 39–117)
ALT: 15 U/L (ref 0–35)
AST: 22 U/L (ref 0–37)
Bilirubin, Direct: 0 mg/dL (ref 0.0–0.3)
Total Bilirubin: 0.5 mg/dL (ref 0.2–1.2)
Total Protein: 7.7 g/dL (ref 6.0–8.3)

## 2014-08-30 LAB — TSH: TSH: 1.25 u[IU]/mL (ref 0.35–4.50)

## 2014-08-30 LAB — HEMOGLOBIN A1C: HEMOGLOBIN A1C: 5.3 % (ref 4.6–6.5)

## 2014-08-31 ENCOUNTER — Encounter: Payer: Self-pay | Admitting: Family Medicine

## 2014-09-04 ENCOUNTER — Encounter: Payer: Self-pay | Admitting: Family Medicine

## 2014-09-04 DIAGNOSIS — D1730 Benign lipomatous neoplasm of skin and subcutaneous tissue of unspecified sites: Secondary | ICD-10-CM

## 2014-09-04 HISTORY — DX: Benign lipomatous neoplasm of skin and subcutaneous tissue of unspecified sites: D17.30

## 2014-09-04 NOTE — Assessment & Plan Note (Signed)
1 small roughly 1 cm lesion present for quite some time and not changing over right anterior thigh. Discussed options and likely diagnoses. She will monitor and if it grows or becomes symptomatic we will proceed with imaging. She will notify us if she develops any concerns

## 2014-09-04 NOTE — Progress Notes (Signed)
Patient ID: Ellen Campbell, female   DOB: 02/28/1976, 38 y.o.   MRN: [id number] Ellen Campbell [id number] 08-23-1976 09/04/2014      Progress Note-Follow Up  Subjective  Chief Complaint  Chief Complaint  Patient presents with  . Annual Exam    physical  . Injections    flu and tdap    HPI  Patient is a 38 year old female in today for routine medical care. In today for annual exam. Feeling well. No recent illness. Is noting a long-standing lesion on her right anterior thigh that she's just been forgetting to tell us about. It is nontender and is not growing. No other acute complaints. Denies recent illness malaise, or myalgias. Denies CP/palp/SOB/HA/congestion/fevers/GI or GU c/o. Taking meds as prescribed  Past Medical History  Diagnosis Date  . Chicken pox as a kid  . Allergy   . Allergic state   . Enlarged tonsils 08/22/2013  . Hyponatremia 08/22/2013  . Preventative health care 08/22/2013  . Lipoma of skin and subcutaneous tissue 09/04/2014    Past Surgical History  Procedure Laterality Date  . Wisdom tooth extraction  38 yrs old    Family History  Problem Relation Age of Onset  . Thyroid disease Mother   . Cancer Mother     uterine,   . Diabetes Mother     hyperglycemia  . Hypertension Father   . Cancer Father     prostate  . Other Paternal Grandmother     brain tumor  . Diabetes Maternal Aunt     History   Social History  . Marital Status: Married    Spouse Name: N/A    Number of Children: N/A  . Years of Education: N/A   Occupational History  . Not on file.   Social History Main Topics  . Smoking status: Never Smoker   . Smokeless tobacco: Never Used  . Alcohol Use: Yes     Comment: 2 glasses of wine daily  . Drug Use: No  . Sexual Activity: Yes     Comment: lives with husband, works for [employer], no major dietary restrictions   Other Topics Concern  . Not on file   Social History Narrative  . No narrative on file    Current  Outpatient Prescriptions on File Prior to Visit  Medication Sig Dispense Refill  . cetirizine (ZYRTEC) 10 MG tablet Take 10 mg by mouth daily.      . fluticasone (FLONASE) 50 MCG/ACT nasal spray PLACE 1 SPRAY INTO THE NOSE DAILY.  16 g  8  . norethindrone-ethinyl estradiol (JUNEL FE,GILDESS FE,LOESTRIN FE) 1-20 MG-MCG tablet Take 1 tablet by mouth daily.       No current facility-administered medications on file prior to visit.    Allergies  Allergen Reactions  . Ciprofloxacin     swelling    Review of Systems  Review of Systems  Constitutional: Negative for fever, chills and malaise/fatigue.  HENT: Negative for congestion, hearing loss and nosebleeds.   Eyes: Negative for discharge.  Respiratory: Negative for cough, sputum production, shortness of breath and wheezing.   Cardiovascular: Negative for chest pain, palpitations and leg swelling.  Gastrointestinal: Negative for heartburn, nausea, vomiting, abdominal pain, diarrhea, constipation and blood in stool.  Genitourinary: Negative for dysuria, urgency, frequency and hematuria.  Musculoskeletal: Negative for back pain, falls and myalgias.  Skin: Negative for rash.  Neurological: Negative for dizziness, tremors, sensory change, focal weakness, loss of consciousness, weakness and headaches.  Endo/Heme/Allergies:  Negative for polydipsia. Does not bruise/bleed easily.  Psychiatric/Behavioral: Negative for depression and suicidal ideas. The patient is not nervous/anxious and does not have insomnia.     Objective  BP 111/68  Pulse 66  Temp(Src) 98.6 F (37 C) (Oral)  Ht 5' 9"  (1.753 m)  Wt 149 lb 6.4 oz (67.767 kg)  BMI 22.05 kg/m2  SpO2 100%  LMP 08/23/2014  Physical Exam  Physical Exam  Constitutional: She is oriented to person, place, and time and well-developed, well-nourished, and in no distress. No distress.  HENT:  Head: Normocephalic and atraumatic.  Right Ear: External ear normal.  Left Ear: External ear  normal.  Nose: Nose normal.  Mouth/Throat: Oropharynx is clear and moist. No oropharyngeal exudate.  Eyes: Conjunctivae are normal. Pupils are equal, round, and reactive to light. Right eye exhibits no discharge. Left eye exhibits no discharge. No scleral icterus.  Neck: Normal range of motion. Neck supple. No thyromegaly present.  Cardiovascular: Normal rate, regular rhythm, normal heart sounds and intact distal pulses.   No murmur heard. Pulmonary/Chest: Effort normal and breath sounds normal. No respiratory distress. She has no wheezes. She has no rales.  Abdominal: Soft. Bowel sounds are normal. She exhibits no distension and no mass. There is no tenderness.  Musculoskeletal: Normal range of motion. She exhibits no edema and no tenderness.  Lymphadenopathy:    She has no cervical adenopathy.  Neurological: She is alert and oriented to person, place, and time. She has normal reflexes. No cranial nerve deficit. Coordination normal.  Skin: Skin is warm and dry. No rash noted. She is not diaphoretic.  Psychiatric: Mood, memory and affect normal.    Lab Results  Component Value Date   TSH 1.25 08/29/2014   Lab Results  Component Value Date   WBC 7.3 08/29/2014   HGB 11.6* 08/29/2014   HCT 34.1* 08/29/2014   MCV 96.0 08/29/2014   PLT 315.0 08/29/2014   Lab Results  Component Value Date   CREATININE 0.8 08/29/2014   BUN 11 08/29/2014   NA 135 08/29/2014   K 3.8 08/29/2014   CL 102 08/29/2014   CO2 26 08/29/2014   Lab Results  Component Value Date   ALT 15 08/29/2014   AST 22 08/29/2014   ALKPHOS 33* 08/29/2014   BILITOT 0.5 08/29/2014   Lab Results  Component Value Date   CHOL 188 08/29/2014   Lab Results  Component Value Date   HDL 80.10 08/29/2014   Lab Results  Component Value Date   LDLCALC 93 08/29/2014   Lab Results  Component Value Date   TRIG 74.0 08/29/2014   Lab Results  Component Value Date   CHOLHDL 2 08/29/2014     Assessment & Plan  Preventative health  care Patient encouraged to maintain heart healthy diet, regular exercise, adequate sleep. Consider daily probiotics. Take medications as prescribed. Given flu shot and Tdap today. Follows with Dr Harle Battiest of GYN for paps and MGMs  Lipoma of skin and subcutaneous tissue 1 small roughly 1 cm lesion present for quite some time and not changing over right anterior thigh. Discussed options and likely diagnoses. She will monitor and if it grows or becomes symptomatic we will proceed with imaging. She will notify us if she develops any concerns

## 2014-09-04 NOTE — Assessment & Plan Note (Signed)
Patient encouraged to maintain heart healthy diet, regular exercise, adequate sleep. Consider daily probiotics. Take medications as prescribed. Given flu shot and Tdap today. Follows with Dr Harle Battiest of GYN for paps and [facility]

## 2015-01-10 ENCOUNTER — Other Ambulatory Visit: Payer: Self-pay | Admitting: Family Medicine

## 2015-08-25 ENCOUNTER — Telehealth: Payer: Self-pay | Admitting: Family Medicine

## 2015-08-25 NOTE — Telephone Encounter (Signed)
Left a message for call back.  

## 2015-08-25 NOTE — Telephone Encounter (Signed)
Advise on what future labs to enter and I will do do.

## 2015-08-25 NOTE — Telephone Encounter (Signed)
Patient will call insurance to find out if insurance cover pre visit labs

## 2015-08-25 NOTE — Telephone Encounter (Signed)
Her labs last year were 10/5 I am afraid since they were normal last year for the most part insurance will not pay for them before that. Some insurance will pay for previsit physical labs ans some will not. She should check with them if she is worried because I have no way of knowing. I usually order TSH, CBC, CMP and lipids for preventative purposes and are often paid for better if we wait til her appt to do them

## 2015-08-25 NOTE — Telephone Encounter (Signed)
Pt has cpe 09/05/15. Pt called to have previsit labs 08/29/15. Please enter orders as needed (I didn't see them in).

## 2015-08-29 ENCOUNTER — Other Ambulatory Visit (INDEPENDENT_AMBULATORY_CARE_PROVIDER_SITE_OTHER): Payer: Managed Care, Other (non HMO)

## 2015-08-29 DIAGNOSIS — Z0000 Encounter for general adult medical examination without abnormal findings: Secondary | ICD-10-CM | POA: Diagnosis not present

## 2015-08-29 LAB — COMPREHENSIVE METABOLIC PANEL
ALBUMIN: 4 g/dL (ref 3.5–5.2)
ALT: 10 U/L (ref 0–35)
AST: 14 U/L (ref 0–37)
Alkaline Phosphatase: 39 U/L (ref 39–117)
BUN: 13 mg/dL (ref 6–23)
CALCIUM: 9.2 mg/dL (ref 8.4–10.5)
CHLORIDE: 102 meq/L (ref 96–112)
CO2: 24 mEq/L (ref 19–32)
Creatinine, Ser: 0.81 mg/dL (ref 0.40–1.20)
GFR: 82.76 mL/min (ref >60.00)
Glucose, Bld: 101 mg/dL — ABNORMAL HIGH (ref 70–99)
POTASSIUM: 3.6 meq/L (ref 3.5–5.1)
SODIUM: 135 meq/L (ref 135–145)
Total Bilirubin: 0.4 mg/dL (ref 0.2–1.2)
Total Protein: 7.4 g/dL (ref 6.0–8.3)

## 2015-08-29 LAB — LIPID PANEL
CHOLESTEROL: 162 mg/dL (ref 0–200)
HDL: 78.6 mg/dL (ref >39.00)
LDL CALC: 56 mg/dL (ref 0–99)
NonHDL: 83.57
TRIGLYCERIDES: 136 mg/dL (ref 0.0–149.0)
Total CHOL/HDL Ratio: 2
VLDL: 27.2 mg/dL (ref 0.0–40.0)

## 2015-08-29 LAB — CBC WITH DIFFERENTIAL/PLATELET
BASOS PCT: 0.7 % (ref 0.0–3.0)
Basophils Absolute: 0 10*3/uL (ref 0.0–0.1)
EOS PCT: 3.1 % (ref 0.0–5.0)
Eosinophils Absolute: 0.2 10*3/uL (ref 0.0–0.7)
HCT: 35.4 % — ABNORMAL LOW (ref 36.0–46.0)
HEMOGLOBIN: 12 g/dL (ref 12.0–15.0)
Lymphocytes Relative: 39.2 % (ref 12.0–46.0)
Lymphs Abs: 2.2 10*3/uL (ref 0.7–4.0)
MCHC: 33.8 g/dL (ref 30.0–36.0)
MCV: 95.3 fl (ref 78.0–100.0)
MONO ABS: 0.5 10*3/uL (ref 0.1–1.0)
Monocytes Relative: 9.5 % (ref 3.0–12.0)
Neutro Abs: 2.6 10*3/uL (ref 1.4–7.7)
Neutrophils Relative %: 47.5 % (ref 43.0–77.0)
Platelets: 312 10*3/uL (ref 150.0–400.0)
RBC: 3.72 Mil/uL — ABNORMAL LOW (ref 3.87–5.11)
RDW: 13.6 % (ref 11.5–15.5)
WBC: 5.5 10*3/uL (ref 4.0–10.5)

## 2015-08-29 LAB — TSH: TSH: 3.04 u[IU]/mL (ref 0.35–4.50)

## 2015-09-05 ENCOUNTER — Ambulatory Visit (INDEPENDENT_AMBULATORY_CARE_PROVIDER_SITE_OTHER): Payer: Managed Care, Other (non HMO) | Admitting: Family Medicine

## 2015-09-05 ENCOUNTER — Encounter: Payer: Self-pay | Admitting: Family Medicine

## 2015-09-05 VITALS — BP 115/86 | HR 79 | Temp 99.0°F | Ht 69.0 in | Wt 150.4 lb

## 2015-09-05 DIAGNOSIS — Z0000 Encounter for general adult medical examination without abnormal findings: Secondary | ICD-10-CM

## 2015-09-05 DIAGNOSIS — D649 Anemia, unspecified: Secondary | ICD-10-CM

## 2015-09-05 DIAGNOSIS — E871 Hypo-osmolality and hyponatremia: Secondary | ICD-10-CM

## 2015-09-05 DIAGNOSIS — Z23 Encounter for immunization: Secondary | ICD-10-CM | POA: Diagnosis not present

## 2015-09-05 DIAGNOSIS — R739 Hyperglycemia, unspecified: Secondary | ICD-10-CM

## 2015-09-05 MED ORDER — FLUTICASONE PROPIONATE 50 MCG/ACT NA SUSP: NASAL | Status: DC

## 2015-09-05 NOTE — Progress Notes (Signed)
Pre visit review using our clinic review tool, if applicable. No additional management support is needed unless otherwise documented below in the visit note. 

## 2015-09-05 NOTE — Patient Instructions (Signed)
Preventive Care for Adults, Female A healthy lifestyle and preventive care can promote health and wellness. Preventive health guidelines for women include the following key practices.  A routine yearly physical is a good way to check with your health care provider about your health and preventive screening. It is a chance to share any concerns and updates on your health and to receive a thorough exam.  Visit your dentist for a routine exam and preventive care every 6 months. Brush your teeth twice a day and floss once a day. Good oral hygiene prevents tooth decay and gum disease.  The frequency of eye exams is based on your age, health, family medical history, use of contact lenses, and other factors. Follow your health care provider's recommendations for frequency of eye exams.  Eat a healthy diet. Foods like vegetables, fruits, whole grains, low-fat dairy products, and lean protein foods contain the nutrients you need without too many calories. Decrease your intake of foods high in solid fats, added sugars, and salt. Eat the right amount of calories for you.Get information about a proper diet from your health care provider, if necessary.  Regular physical exercise is one of the most important things you can do for your health. Most adults should get at least 150 minutes of moderate-intensity exercise (any activity that increases your heart rate and causes you to sweat) each week. In addition, most adults need muscle-strengthening exercises on 2 or more days a week.  Maintain a healthy weight. The body mass index (BMI) is a screening tool to identify possible weight problems. It provides an estimate of body fat based on height and weight. Your health care provider can find your BMI and can help you achieve or maintain a healthy weight.For adults 20 years and older:  A BMI below 18.5 is considered underweight.  A BMI of 18.5 to 24.9 is normal.  A BMI of 25 to 29.9 is considered overweight.  A  BMI of 30 and above is considered obese.  Maintain normal blood lipids and cholesterol levels by exercising and minimizing your intake of saturated fat. Eat a balanced diet with plenty of fruit and vegetables. Blood tests for lipids and cholesterol should begin at age 45 and be repeated every 5 years. If your lipid or cholesterol levels are high, you are over 50, or you are at high risk for heart disease, you may need your cholesterol levels checked more frequently.Ongoing high lipid and cholesterol levels should be treated with medicines if diet and exercise are not working.  If you smoke, find out from your health care provider how to quit. If you do not use tobacco, do not start.  Lung cancer screening is recommended for adults aged 45-80 years who are at high risk for developing lung cancer because of a history of smoking. A yearly low-dose CT scan of the lungs is recommended for people who have at least a 30-pack-year history of smoking and are a current smoker or have quit within the past 15 years. A pack year of smoking is smoking an average of 1 pack of cigarettes a day for 1 year (for example: 1 pack a day for 30 years or 2 packs a day for 15 years). Yearly screening should continue until the smoker has stopped smoking for at least 15 years. Yearly screening should be stopped for people who develop a health problem that would prevent them from having lung cancer treatment.  If you are pregnant, do not drink alcohol. If you are  breastfeeding, be very cautious about drinking alcohol. If you are not pregnant and choose to drink alcohol, do not have more than 1 drink per day. One drink is considered to be 12 ounces (355 mL) of beer, 5 ounces (148 mL) of wine, or 1.5 ounces (44 mL) of liquor.  Avoid use of street drugs. Do not share needles with anyone. Ask for help if you need support or instructions about stopping the use of drugs.  High blood pressure causes heart disease and increases the risk  of stroke. Your blood pressure should be checked at least every 1 to 2 years. Ongoing high blood pressure should be treated with medicines if weight loss and exercise do not work.  If you are 55-79 years old, ask your health care provider if you should take aspirin to prevent strokes.  Diabetes screening is done by taking a blood sample to check your blood glucose level after you have not eaten for a certain period of time (fasting). If you are not overweight and you do not have risk factors for diabetes, you should be screened once every 3 years starting at age 45. If you are overweight or obese and you are 40-70 years of age, you should be screened for diabetes every year as part of your cardiovascular risk assessment.  Breast cancer screening is essential preventive care for women. You should practice "breast self-awareness." This means understanding the normal appearance and feel of your breasts and may include breast self-examination. Any changes detected, no matter how small, should be reported to a health care provider. Women in their 20s and 30s should have a clinical breast exam (CBE) by a health care provider as part of a regular health exam every 1 to 3 years. After age 40, women should have a CBE every year. Starting at age 40, women should consider having a mammogram (breast X-ray test) every year. Women who have a family history of breast cancer should talk to their health care provider about genetic screening. Women at a high risk of breast cancer should talk to their health care providers about having an MRI and a mammogram every year.  Breast cancer gene (BRCA)-related cancer risk assessment is recommended for women who have family members with BRCA-related cancers. BRCA-related cancers include breast, ovarian, tubal, and peritoneal cancers. Having family members with these cancers may be associated with an increased risk for harmful changes (mutations) in the breast cancer genes BRCA1 and  BRCA2. Results of the assessment will determine the need for genetic counseling and BRCA1 and BRCA2 testing.  Your health care provider may recommend that you be screened regularly for cancer of the pelvic organs (ovaries, uterus, and vagina). This screening involves a pelvic examination, including checking for microscopic changes to the surface of your cervix (Pap test). You may be encouraged to have this screening done every 3 years, beginning at age 21.  For women ages 30-65, health care providers may recommend pelvic exams and Pap testing every 3 years, or they may recommend the Pap and pelvic exam, combined with testing for human papilloma virus (HPV), every 5 years. Some types of HPV increase your risk of cervical cancer. Testing for HPV may also be done on women of any age with unclear Pap test results.  Other health care providers may not recommend any screening for nonpregnant women who are considered low risk for pelvic cancer and who do not have symptoms. Ask your health care provider if a screening pelvic exam is right for   you.  If you have had past treatment for cervical cancer or a condition that could lead to cancer, you need Pap tests and screening for cancer for at least 20 years after your treatment. If Pap tests have been discontinued, your risk factors (such as having a new sexual partner) need to be reassessed to determine if screening should resume. Some women have medical problems that increase the chance of getting cervical cancer. In these cases, your health care provider may recommend more frequent screening and Pap tests.  Colorectal cancer can be detected and often prevented. Most routine colorectal cancer screening begins at the age of 50 years and continues through age 75 years. However, your health care provider may recommend screening at an earlier age if you have risk factors for colon cancer. On a yearly basis, your health care provider may provide home test kits to check  for hidden blood in the stool. Use of a small camera at the end of a tube, to directly examine the colon (sigmoidoscopy or colonoscopy), can detect the earliest forms of colorectal cancer. Talk to your health care provider about this at age 50, when routine screening begins. Direct exam of the colon should be repeated every 5-10 years through age 75 years, unless early forms of precancerous polyps or small growths are found.  People who are at an increased risk for hepatitis B should be screened for this virus. You are considered at high risk for hepatitis B if:  You were born in a country where hepatitis B occurs often. Talk with your health care provider about which countries are considered high risk.  Your parents were born in a high-risk country and you have not received a shot to protect against hepatitis B (hepatitis B vaccine).  You have HIV or AIDS.  You use needles to inject street drugs.  You live with, or have sex with, someone who has hepatitis B.  You get hemodialysis treatment.  You take certain medicines for conditions like cancer, organ transplantation, and autoimmune conditions.  Hepatitis C blood testing is recommended for all people born from 1945 through 1965 and any individual with known risks for hepatitis C.  Practice safe sex. Use condoms and avoid high-risk sexual practices to reduce the spread of sexually transmitted infections (STIs). STIs include gonorrhea, chlamydia, syphilis, trichomonas, herpes, HPV, and human immunodeficiency virus (HIV). Herpes, HIV, and HPV are viral illnesses that have no cure. They can result in disability, cancer, and death.  You should be screened for sexually transmitted illnesses (STIs) including gonorrhea and chlamydia if:  You are sexually active and are younger than 24 years.  You are older than 24 years and your health care provider tells you that you are at risk for this type of infection.  Your sexual activity has changed  since you were last screened and you are at an increased risk for chlamydia or gonorrhea. Ask your health care provider if you are at risk.  If you are at risk of being infected with HIV, it is recommended that you take a prescription medicine daily to prevent HIV infection. This is called preexposure prophylaxis (PrEP). You are considered at risk if:  You are sexually active and do not regularly use condoms or know the HIV status of your partner(s).  You take drugs by injection.  You are sexually active with a partner who has HIV.  Talk with your health care provider about whether you are at high risk of being infected with HIV. If   you choose to begin PrEP, you should first be tested for HIV. You should then be tested every 3 months for as long as you are taking PrEP.  Osteoporosis is a disease in which the bones lose minerals and strength with aging. This can result in serious bone fractures or breaks. The risk of osteoporosis can be identified using a bone density scan. Women ages 67 years and over and women at risk for fractures or osteoporosis should discuss screening with their health care providers. Ask your health care provider whether you should take a calcium supplement or vitamin D to reduce the rate of osteoporosis.  Menopause can be associated with physical symptoms and risks. Hormone replacement therapy is available to decrease symptoms and risks. You should talk to your health care provider about whether hormone replacement therapy is right for you.  Use sunscreen. Apply sunscreen liberally and repeatedly throughout the day. You should seek shade when your shadow is shorter than you. Protect yourself by wearing long sleeves, pants, a wide-brimmed hat, and sunglasses year round, whenever you are outdoors.  Once a month, do a whole body skin exam, using a mirror to look at the skin on your back. Tell your health care provider of new moles, moles that have irregular borders, moles that  are larger than a pencil eraser, or moles that have changed in shape or color.  Stay current with required vaccines (immunizations).  Influenza vaccine. All adults should be immunized every year.  Tetanus, diphtheria, and acellular pertussis (Td, Tdap) vaccine. Pregnant women should receive 1 dose of Tdap vaccine during each pregnancy. The dose should be obtained regardless of the length of time since the last dose. Immunization is preferred during the 27th-36th week of gestation. An adult who has not previously received Tdap or who does not know her vaccine status should receive 1 dose of Tdap. This initial dose should be followed by tetanus and diphtheria toxoids (Td) booster doses every 10 years. Adults with an unknown or incomplete history of completing a 3-dose immunization series with Td-containing vaccines should begin or complete a primary immunization series including a Tdap dose. Adults should receive a Td booster every 10 years.  Varicella vaccine. An adult without evidence of immunity to varicella should receive 2 doses or a second dose if she has previously received 1 dose. Pregnant females who do not have evidence of immunity should receive the first dose after pregnancy. This first dose should be obtained before leaving the health care facility. The second dose should be obtained 4-8 weeks after the first dose.  Human papillomavirus (HPV) vaccine. Females aged 13-26 years who have not received the vaccine previously should obtain the 3-dose series. The vaccine is not recommended for use in pregnant females. However, pregnancy testing is not needed before receiving a dose. If a female is found to be pregnant after receiving a dose, no treatment is needed. In that case, the remaining doses should be delayed until after the pregnancy. Immunization is recommended for any person with an immunocompromised condition through the age of 61 years if she did not get any or all doses earlier. During the  3-dose series, the second dose should be obtained 4-8 weeks after the first dose. The third dose should be obtained 24 weeks after the first dose and 16 weeks after the second dose.  Zoster vaccine. One dose is recommended for adults aged 30 years or older unless certain conditions are present.  Measles, mumps, and rubella (MMR) vaccine. Adults born  before 1957 generally are considered immune to measles and mumps. Adults born in 1957 or later should have 1 or more doses of MMR vaccine unless there is a contraindication to the vaccine or there is laboratory evidence of immunity to each of the three diseases. A routine second dose of MMR vaccine should be obtained at least 28 days after the first dose for students attending postsecondary schools, health care workers, or international travelers. People who received inactivated measles vaccine or an unknown type of measles vaccine during 1963-1967 should receive 2 doses of MMR vaccine. People who received inactivated mumps vaccine or an unknown type of mumps vaccine before 1979 and are at high risk for mumps infection should consider immunization with 2 doses of MMR vaccine. For females of childbearing age, rubella immunity should be determined. If there is no evidence of immunity, females who are not pregnant should be vaccinated. If there is no evidence of immunity, females who are pregnant should delay immunization until after pregnancy. Unvaccinated health care workers born before 1957 who lack laboratory evidence of measles, mumps, or rubella immunity or laboratory confirmation of disease should consider measles and mumps immunization with 2 doses of MMR vaccine or rubella immunization with 1 dose of MMR vaccine.  Pneumococcal 13-valent conjugate (PCV13) vaccine. When indicated, a person who is uncertain of his immunization history and has no record of immunization should receive the PCV13 vaccine. All adults 65 years of age and older should receive this  vaccine. An adult aged 19 years or older who has certain medical conditions and has not been previously immunized should receive 1 dose of PCV13 vaccine. This PCV13 should be followed with a dose of pneumococcal polysaccharide (PPSV23) vaccine. Adults who are at high risk for pneumococcal disease should obtain the PPSV23 vaccine at least 8 weeks after the dose of PCV13 vaccine. Adults older than 41 years of age who have normal immune system function should obtain the PPSV23 vaccine dose at least 1 year after the dose of PCV13 vaccine.  Pneumococcal polysaccharide (PPSV23) vaccine. When PCV13 is also indicated, PCV13 should be obtained first. All adults aged 65 years and older should be immunized. An adult younger than age 65 years who has certain medical conditions should be immunized. Any person who resides in a nursing home or long-term care facility should be immunized. An adult smoker should be immunized. People with an immunocompromised condition and certain other conditions should receive both PCV13 and PPSV23 vaccines. People with human immunodeficiency virus (HIV) infection should be immunized as soon as possible after diagnosis. Immunization during chemotherapy or radiation therapy should be avoided. Routine use of PPSV23 vaccine is not recommended for American Indians, [place] Natives, or people younger than 65 years unless there are medical conditions that require PPSV23 vaccine. When indicated, people who have unknown immunization and have no record of immunization should receive PPSV23 vaccine. One-time revaccination 5 years after the first dose of PPSV23 is recommended for people aged 19-64 years who have chronic kidney failure, nephrotic syndrome, asplenia, or immunocompromised conditions. People who received 1-2 doses of PPSV23 before age 65 years should receive another dose of PPSV23 vaccine at age 65 years or later if at least 5 years have passed since the previous dose. Doses of PPSV23 are not  needed for people immunized with PPSV23 at or after age 65 years.  Meningococcal vaccine. Adults with asplenia or persistent complement component deficiencies should receive 2 doses of quadrivalent meningococcal conjugate (MenACWY-D) vaccine. The doses should be obtained   at least 2 months apart. Microbiologists working with certain meningococcal bacteria, [place] recruits, people at risk during an outbreak, and people who travel to or live in countries with a high rate of meningitis should be immunized. A first-year college student up through age 34 years who is living in a residence hall should receive a dose if she did not receive a dose on or after her 16th birthday. Adults who have certain high-risk conditions should receive one or more doses of vaccine.  Hepatitis A vaccine. Adults who wish to be protected from this disease, have certain high-risk conditions, work with hepatitis A-infected animals, work in hepatitis A research labs, or travel to or work in countries with a high rate of hepatitis A should be immunized. Adults who were previously unvaccinated and who anticipate close contact with an international adoptee during the first 60 days after arrival in the [place] from a country with a high rate of hepatitis A should be immunized.  Hepatitis B vaccine. Adults who wish to be protected from this disease, have certain high-risk conditions, may be exposed to blood or other infectious body fluids, are household contacts or sex partners of hepatitis B positive people, are clients or workers in certain care facilities, or travel to or work in countries with a high rate of hepatitis B should be immunized.  Haemophilus influenzae type b (Hib) vaccine. A previously unvaccinated person with asplenia or sickle cell disease or having a scheduled splenectomy should receive 1 dose of Hib vaccine. Regardless of previous immunization, a recipient of a hematopoietic stem cell transplant should receive a  3-dose series 6-12 months after her successful transplant. Hib vaccine is not recommended for adults with HIV infection. Preventive Services / Frequency Ages 35 to 4 years  Blood pressure check.** / Every 3-5 years.  Lipid and cholesterol check.** / Every 5 years beginning at age 60.  Clinical breast exam.** / Every 3 years for women in their 71s and 10s.  BRCA-related cancer risk assessment.** / For women who have family members with a BRCA-related cancer (breast, ovarian, tubal, or peritoneal cancers).  Pap test.** / Every 2 years from ages 76 through 26. Every 3 years starting at age 61 through age 76 or 93 with a history of 3 consecutive normal Pap tests.  HPV screening.** / Every 3 years from ages 37 through ages 60 to 51 with a history of 3 consecutive normal Pap tests.  Hepatitis C blood test.** / For any individual with known risks for hepatitis C.  Skin self-exam. / Monthly.  Influenza vaccine. / Every year.  Tetanus, diphtheria, and acellular pertussis (Tdap, Td) vaccine.** / Consult your health care provider. Pregnant women should receive 1 dose of Tdap vaccine during each pregnancy. 1 dose of Td every 10 years.  Varicella vaccine.** / Consult your health care provider. Pregnant females who do not have evidence of immunity should receive the first dose after pregnancy.  HPV vaccine. / 3 doses over 6 months, if 93 and younger. The vaccine is not recommended for use in pregnant females. However, pregnancy testing is not needed before receiving a dose.  Measles, mumps, rubella (MMR) vaccine.** / You need at least 1 dose of MMR if you were born in 1957 or later. You may also need a 2nd dose. For females of childbearing age, rubella immunity should be determined. If there is no evidence of immunity, females who are not pregnant should be vaccinated. If there is no evidence of immunity, females who are  pregnant should delay immunization until after pregnancy.  Pneumococcal  13-valent conjugate (PCV13) vaccine.** / Consult your health care provider.  Pneumococcal polysaccharide (PPSV23) vaccine.** / 1 to 2 doses if you smoke cigarettes or if you have certain conditions.  Meningococcal vaccine.** / 1 dose if you are age 68 to 8 years and a Market researcher living in a residence hall, or have one of several medical conditions, you need to get vaccinated against meningococcal disease. You may also need additional booster doses.  Hepatitis A vaccine.** / Consult your health care provider.  Hepatitis B vaccine.** / Consult your health care provider.  Haemophilus influenzae type b (Hib) vaccine.** / Consult your health care provider. Ages 7 to 53 years  Blood pressure check.** / Every year.  Lipid and cholesterol check.** / Every 5 years beginning at age 25 years.  Lung cancer screening. / Every year if you are aged 11-80 years and have a 30-pack-year history of smoking and currently smoke or have quit within the past 15 years. Yearly screening is stopped once you have quit smoking for at least 15 years or develop a health problem that would prevent you from having lung cancer treatment.  Clinical breast exam.** / Every year after age 48 years.  BRCA-related cancer risk assessment.** / For women who have family members with a BRCA-related cancer (breast, ovarian, tubal, or peritoneal cancers).  Mammogram.** / Every year beginning at age 41 years and continuing for as long as you are in good health. Consult with your health care provider.  Pap test.** / Every 3 years starting at age 65 years through age 37 or 70 years with a history of 3 consecutive normal Pap tests.  HPV screening.** / Every 3 years from ages 72 years through ages 60 to 40 years with a history of 3 consecutive normal Pap tests.  Fecal occult blood test (FOBT) of stool. / Every year beginning at age 21 years and continuing until age 5 years. You may not need to do this test if you get  a colonoscopy every 10 years.  Flexible sigmoidoscopy or colonoscopy.** / Every 5 years for a flexible sigmoidoscopy or every 10 years for a colonoscopy beginning at age 35 years and continuing until age 48 years.  Hepatitis C blood test.** / For all people born from [birth year] through 1965 and any individual with known risks for hepatitis C.  Skin self-exam. / Monthly.  Influenza vaccine. / Every year.  Tetanus, diphtheria, and acellular pertussis (Tdap/Td) vaccine.** / Consult your health care provider. Pregnant women should receive 1 dose of Tdap vaccine during each pregnancy. 1 dose of Td every 10 years.  Varicella vaccine.** / Consult your health care provider. Pregnant females who do not have evidence of immunity should receive the first dose after pregnancy.  Zoster vaccine.** / 1 dose for adults aged 30 years or older.  Measles, mumps, rubella (MMR) vaccine.** / You need at least 1 dose of MMR if you were born in 1957 or later. You may also need a second dose. For females of childbearing age, rubella immunity should be determined. If there is no evidence of immunity, females who are not pregnant should be vaccinated. If there is no evidence of immunity, females who are pregnant should delay immunization until after pregnancy.  Pneumococcal 13-valent conjugate (PCV13) vaccine.** / Consult your health care provider.  Pneumococcal polysaccharide (PPSV23) vaccine.** / 1 to 2 doses if you smoke cigarettes or if you have certain conditions.  Meningococcal vaccine.** /  Consult your health care provider.  Hepatitis A vaccine.** / Consult your health care provider.  Hepatitis B vaccine.** / Consult your health care provider.  Haemophilus influenzae type b (Hib) vaccine.** / Consult your health care provider. Ages 64 years and over  Blood pressure check.** / Every year.  Lipid and cholesterol check.** / Every 5 years beginning at age 23 years.  Lung cancer screening. / Every year if you  are aged 16-80 years and have a 30-pack-year history of smoking and currently smoke or have quit within the past 15 years. Yearly screening is stopped once you have quit smoking for at least 15 years or develop a health problem that would prevent you from having lung cancer treatment.  Clinical breast exam.** / Every year after age 74 years.  BRCA-related cancer risk assessment.** / For women who have family members with a BRCA-related cancer (breast, ovarian, tubal, or peritoneal cancers).  Mammogram.** / Every year beginning at age 44 years and continuing for as long as you are in good health. Consult with your health care provider.  Pap test.** / Every 3 years starting at age 58 years through age 22 or 39 years with 3 consecutive normal Pap tests. Testing can be stopped between 65 and 70 years with 3 consecutive normal Pap tests and no abnormal Pap or HPV tests in the past 10 years.  HPV screening.** / Every 3 years from ages 64 years through ages 70 or 61 years with a history of 3 consecutive normal Pap tests. Testing can be stopped between 65 and 70 years with 3 consecutive normal Pap tests and no abnormal Pap or HPV tests in the past 10 years.  Fecal occult blood test (FOBT) of stool. / Every year beginning at age 40 years and continuing until age 27 years. You may not need to do this test if you get a colonoscopy every 10 years.  Flexible sigmoidoscopy or colonoscopy.** / Every 5 years for a flexible sigmoidoscopy or every 10 years for a colonoscopy beginning at age 7 years and continuing until age 32 years.  Hepatitis C blood test.** / For all people born from [birth year] through 1967 and any individual with known risks for hepatitis C.  Osteoporosis screening.** / A one-time screening for women ages 30 years and over and women at risk for fractures or osteoporosis.  Skin self-exam. / Monthly.  Influenza vaccine. / Every year.  Tetanus, diphtheria, and acellular pertussis (Tdap/Td)  vaccine.** / 1 dose of Td every 10 years.  Varicella vaccine.** / Consult your health care provider.  Zoster vaccine.** / 1 dose for adults aged 35 years or older.  Pneumococcal 13-valent conjugate (PCV13) vaccine.** / Consult your health care provider.  Pneumococcal polysaccharide (PPSV23) vaccine.** / 1 dose for all adults aged 46 years and older.  Meningococcal vaccine.** / Consult your health care provider.  Hepatitis A vaccine.** / Consult your health care provider.  Hepatitis B vaccine.** / Consult your health care provider.  Haemophilus influenzae type b (Hib) vaccine.** / Consult your health care provider. ** Family history and personal history of risk and conditions may change your health care provider's recommendations.   This information is not intended to replace advice given to you by your health care provider. Make sure you discuss any questions you have with your health care provider.   Document Released: 01/07/2002 Document Revised: 12/02/2014 Document Reviewed: 04/08/2011 Elsevier Interactive Patient Education [employer].

## 2015-09-13 ENCOUNTER — Encounter: Payer: Self-pay | Admitting: Family Medicine

## 2015-09-13 NOTE — Progress Notes (Signed)
Subjective:    Patient ID: Ellen Campbell, female    DOB: [birth year], 39 y.o.   MRN: [id number]  Chief Complaint  Patient presents with  . Annual Exam    HPI Patient is in today for annual exam. Is doing well. No recent illness. Is trying to maintain a heart healthy diet and stay active. No recent flare in allergies. No polyuria or polydipsia. Poorly controlled will alter medications, encouraged DASH diet, minimize caffeine and obtain adequate sleep. Report concerning symptoms and follow up as directed and as needed  Past Medical History  Diagnosis Date  . Chicken pox as a kid  . Allergy   . Allergic state   . Enlarged tonsils 08/22/2013  . Hyponatremia 08/22/2013  . Preventative health care 08/22/2013  . Lipoma of skin and subcutaneous tissue 09/04/2014  . Anemia 09/15/2015  . Hyperglycemia 09/15/2015    Past Surgical History  Procedure Laterality Date  . Wisdom tooth extraction  39 yrs old    Family History  Problem Relation Age of Onset  . Thyroid disease Mother   . Cancer Mother     uterine,   . Diabetes Mother     hyperglycemia  . Hypertension Father   . Cancer Father     prostate  . Other Paternal Grandmother     brain tumor  . Diabetes Maternal Aunt     Social History   Social History  . Marital Status: Married    Spouse Name: N/A  . Number of Children: N/A  . Years of Education: N/A   Occupational History  . Not on file.   Social History Main Topics  . Smoking status: Never Smoker   . Smokeless tobacco: Never Used  . Alcohol Use: Yes     Comment: 2 glasses of wine daily  . Drug Use: No  . Sexual Activity: Yes     Comment: lives with husband, works for [employer], no major dietary restrictions   Other Topics Concern  . Not on file   Social History Narrative    Outpatient Prescriptions Prior to Visit  Medication Sig Dispense Refill  . cetirizine (ZYRTEC) 10 MG tablet Take 10 mg by mouth daily.    . norethindrone-ethinyl estradiol  (JUNEL FE,GILDESS FE,LOESTRIN FE) 1-20 MG-MCG tablet Take 1 tablet by mouth daily.    . fluticasone (FLONASE) 50 MCG/ACT nasal spray PLACE 1 SPRAY INTO THE NOSE DAILY. 16 g 8   No facility-administered medications prior to visit.    Allergies  Allergen Reactions  . Ciprofloxacin     swelling    Review of Systems  Constitutional: Negative for fever, chills and malaise/fatigue.  HENT: Negative for congestion and hearing loss.   Eyes: Negative for discharge.  Respiratory: Negative for cough, sputum production and shortness of breath.   Cardiovascular: Negative for chest pain, palpitations and leg swelling.  Gastrointestinal: Negative for heartburn, nausea, vomiting, abdominal pain, diarrhea, constipation and blood in stool.  Genitourinary: Negative for dysuria, urgency, frequency and hematuria.  Musculoskeletal: Negative for myalgias, back pain and falls.  Skin: Negative for rash.  Neurological: Negative for dizziness, sensory change, loss of consciousness, weakness and headaches.  Endo/Heme/Allergies: Negative for environmental allergies. Does not bruise/bleed easily.  Psychiatric/Behavioral: Negative for depression and suicidal ideas. The patient is not nervous/anxious and does not have insomnia.        Objective:    Physical Exam  Constitutional: She is oriented to person, place, and time. She appears well-developed and well-nourished. No distress.  HENT:  Head: Normocephalic and atraumatic.  Eyes: Conjunctivae are normal.  Neck: Neck supple. No thyromegaly present.  Cardiovascular: Normal rate, regular rhythm and normal heart sounds.   No murmur heard. Pulmonary/Chest: Effort normal and breath sounds normal. No respiratory distress.  Abdominal: Soft. Bowel sounds are normal. She exhibits no distension and no mass. There is no tenderness.  Musculoskeletal: She exhibits no edema.  Lymphadenopathy:    She has no cervical adenopathy.  Neurological: She is alert and oriented to  person, place, and time.  Skin: Skin is warm and dry.  Psychiatric: She has a normal mood and affect. Her behavior is normal.    BP 115/86 mmHg  Pulse 79  Temp(Src) 99 F (37.2 C) (Oral)  Ht 5' 9"  (1.753 m)  Wt 150 lb 6 oz (68.21 kg)  BMI 22.20 kg/m2  SpO2 100%  LMP 08/21/2015 Wt Readings from Last 3 Encounters:  09/05/15 150 lb 6 oz (68.21 kg)  08/29/14 149 lb 6.4 oz (67.767 kg)  04/26/14 149 lb (67.586 kg)     Lab Results  Component Value Date   WBC 5.5 08/29/2015   HGB 12.0 08/29/2015   HCT 35.4* 08/29/2015   PLT 312.0 08/29/2015   GLUCOSE 101* 08/29/2015   CHOL 162 08/29/2015   TRIG 136.0 08/29/2015   HDL 78.60 08/29/2015   LDLCALC 56 08/29/2015   ALT 10 08/29/2015   AST 14 08/29/2015   NA 135 08/29/2015   K 3.6 08/29/2015   CL 102 08/29/2015   CREATININE 0.81 08/29/2015   BUN 13 08/29/2015   CO2 24 08/29/2015   TSH 3.04 08/29/2015   HGBA1C 5.3 08/29/2014    Lab Results  Component Value Date   TSH 3.04 08/29/2015   Lab Results  Component Value Date   WBC 5.5 08/29/2015   HGB 12.0 08/29/2015   HCT 35.4* 08/29/2015   MCV 95.3 08/29/2015   PLT 312.0 08/29/2015   Lab Results  Component Value Date   NA 135 08/29/2015   K 3.6 08/29/2015   CO2 24 08/29/2015   GLUCOSE 101* 08/29/2015   BUN 13 08/29/2015   CREATININE 0.81 08/29/2015   BILITOT 0.4 08/29/2015   ALKPHOS 39 08/29/2015   AST 14 08/29/2015   ALT 10 08/29/2015   PROT 7.4 08/29/2015   ALBUMIN 4.0 08/29/2015   CALCIUM 9.2 08/29/2015   GFR 82.76 08/29/2015   Lab Results  Component Value Date   CHOL 162 08/29/2015   Lab Results  Component Value Date   HDL 78.60 08/29/2015   Lab Results  Component Value Date   LDLCALC 56 08/29/2015   Lab Results  Component Value Date   TRIG 136.0 08/29/2015   Lab Results  Component Value Date   CHOLHDL 2 08/29/2015   Lab Results  Component Value Date   HGBA1C 5.3 08/29/2014       Assessment & Plan:   Problem List Items Addressed This  Visit    Preventative health care    Patient encouraged to maintain heart healthy diet, regular exercise, adequate sleep. Consider daily probiotics. Take medications as prescribed. Given and reviewed copy of ACP documents from [employer] and encouraged to complete and return. Labs reviewed. Flu shot given. Follows with GYN for paps and MGMs      Relevant Orders   TSH   CBC   Lipid panel   Comprehensive metabolic panel   Hyponatremia    Resolved on lab work      Hyperglycemia  minimize simple carbs. Increase exercise as tolerated.       Relevant Orders   TSH   CBC   Lipid panel   Comprehensive metabolic panel   Hemoglobin A1c   Anemia    Very mild, Increase leafy greens, consider increased lean red meat and using cast iron cookware. Continue to monitor, report any concerns       Other Visit Diagnoses    Encounter for immunization    -  Primary       I am having Ms. Leise maintain her norethindrone-ethinyl estradiol, cetirizine, and fluticasone.  Meds ordered this encounter  Medications  . fluticasone (FLONASE) 50 MCG/ACT nasal spray    Sig: PLACE 1 SPRAY INTO THE NOSE DAILY.    Dispense:  16 g    Refill:  8     Penni Homans, MD

## 2015-09-15 ENCOUNTER — Encounter: Payer: Self-pay | Admitting: Family Medicine

## 2015-09-15 DIAGNOSIS — D649 Anemia, unspecified: Secondary | ICD-10-CM

## 2015-09-15 DIAGNOSIS — R739 Hyperglycemia, unspecified: Secondary | ICD-10-CM

## 2015-09-15 HISTORY — DX: Hyperglycemia, unspecified: R73.9

## 2015-09-15 HISTORY — DX: Anemia, unspecified: D64.9

## 2015-09-15 NOTE — Assessment & Plan Note (Signed)
Patient encouraged to maintain heart healthy diet, regular exercise, adequate sleep. Consider daily probiotics. Take medications as prescribed. Given and reviewed copy of ACP documents from [employer] and encouraged to complete and return. Labs reviewed. Flu shot given. Follows with GYN for paps and MGMs

## 2015-09-15 NOTE — Assessment & Plan Note (Signed)
Very mild, Increase leafy greens, consider increased lean red meat and using cast iron cookware. Continue to monitor, report any concerns

## 2015-09-15 NOTE — Assessment & Plan Note (Signed)
minimize simple carbs. Increase exercise as tolerated.  

## 2015-09-15 NOTE — Assessment & Plan Note (Signed)
Resolved on lab work

## 2015-10-02 ENCOUNTER — Other Ambulatory Visit: Payer: Self-pay | Admitting: Family Medicine

## 2015-10-02 MED ORDER — FLUTICASONE PROPIONATE 50 MCG/ACT NA SUSP: NASAL | Status: DC

## 2016-05-20 ENCOUNTER — Telehealth: Payer: Self-pay | Admitting: Family Medicine

## 2016-05-20 MED ORDER — FLUTICASONE PROPIONATE 50 MCG/ACT NA SUSP: NASAL | Status: DC

## 2016-05-20 NOTE — Telephone Encounter (Signed)
°  Relationship to patient: Self  Can be reached:  [phone]   Pharmacy:  [id number] [phone](Phone) [phone](Fax)         Reason for call: Refill fluticasone (FLONASE) 50 MCG/ACT nasal spray [[[id number]]

## 2016-09-10 ENCOUNTER — Encounter: Payer: Managed Care, Other (non HMO) | Admitting: Family Medicine

## 2016-10-14 ENCOUNTER — Encounter: Payer: Self-pay | Admitting: Family Medicine

## 2016-10-14 ENCOUNTER — Ambulatory Visit (INDEPENDENT_AMBULATORY_CARE_PROVIDER_SITE_OTHER): Payer: Managed Care, Other (non HMO) | Admitting: Family Medicine

## 2016-10-14 VITALS — BP 108/70 | HR 83 | Temp 98.7°F | Ht 69.0 in | Wt 154.2 lb

## 2016-10-14 DIAGNOSIS — R739 Hyperglycemia, unspecified: Secondary | ICD-10-CM

## 2016-10-14 DIAGNOSIS — Z0000 Encounter for general adult medical examination without abnormal findings: Secondary | ICD-10-CM | POA: Diagnosis not present

## 2016-10-14 DIAGNOSIS — Z23 Encounter for immunization: Secondary | ICD-10-CM | POA: Diagnosis not present

## 2016-10-14 DIAGNOSIS — D649 Anemia, unspecified: Secondary | ICD-10-CM | POA: Diagnosis not present

## 2016-10-14 NOTE — Patient Instructions (Signed)
Seborrhea keratosis Preventive Care 40-64 Years, Female Preventive care refers to lifestyle choices and visits with your health care provider that can promote health and wellness. What does preventive care include?  A yearly physical exam. This is also called an annual well check.  Dental exams once or twice a year.  Routine eye exams. Ask your health care provider how often you should have your eyes checked.  Personal lifestyle choices, including:  Daily care of your teeth and gums.  Regular physical activity.  Eating a healthy diet.  Avoiding tobacco and drug use.  Limiting alcohol use.  Practicing safe sex.  Taking low-dose aspirin daily starting at age 63.  Taking vitamin and mineral supplements as recommended by your health care provider. What happens during an annual well check? The services and screenings done by your health care provider during your annual well check will depend on your age, overall health, lifestyle risk factors, and family history of disease. Counseling  Your health care provider may ask you questions about your:  Alcohol use.  Tobacco use.  Drug use.  Emotional well-being.  Home and relationship well-being.  Sexual activity.  Eating habits.  Work and work Statistician.  Method of birth control.  Menstrual cycle.  Pregnancy history. Screening  You may have the following tests or measurements:  Height, weight, and BMI.  Blood pressure.  Lipid and cholesterol levels. These may be checked every 5 years, or more frequently if you are over 58 years old.  Skin check.  Lung cancer screening. You may have this screening every year starting at age 67 if you have a 30-pack-year history of smoking and currently smoke or have quit within the past 15 years.  Fecal occult blood test (FOBT) of the stool. You may have this test every year starting at age 13.  Flexible sigmoidoscopy or colonoscopy. You may have a sigmoidoscopy every 5  years or a colonoscopy every 10 years starting at age 67.  Hepatitis C blood test.  Hepatitis B blood test.  Sexually transmitted disease (STD) testing.  Diabetes screening. This is done by checking your blood sugar (glucose) after you have not eaten for a while (fasting). You may have this done every 1-3 years.  Mammogram. This may be done every 1-2 years. Talk to your health care provider about when you should start having regular mammograms. This may depend on whether you have a family history of breast cancer.  BRCA-related cancer screening. This may be done if you have a family history of breast, ovarian, tubal, or peritoneal cancers.  Pelvic exam and Pap test. This may be done every 3 years starting at age 33. Starting at age 64, this may be done every 5 years if you have a Pap test in combination with an HPV test.  Bone density scan. This is done to screen for osteoporosis. You may have this scan if you are at high risk for osteoporosis. Discuss your test results, treatment options, and if necessary, the need for more tests with your health care provider. Vaccines  Your health care provider may recommend certain vaccines, such as:  Influenza vaccine. This is recommended every year.  Tetanus, diphtheria, and acellular pertussis (Tdap, Td) vaccine. You may need a Td booster every 10 years.  Varicella vaccine. You may need this if you have not been vaccinated.  Zoster vaccine. You may need this after age 60.  Measles, mumps, and rubella (MMR) vaccine. You may need at least one dose of MMR if you  were born in [birth year] or later. You may also need a second dose.  Pneumococcal 13-valent conjugate (PCV13) vaccine. You may need this if you have certain conditions and were not previously vaccinated.  Pneumococcal polysaccharide (PPSV23) vaccine. You may need one or two doses if you smoke cigarettes or if you have certain conditions.  Meningococcal vaccine. You may need this if you have  certain conditions.  Hepatitis A vaccine. You may need this if you have certain conditions or if you travel or work in places where you may be exposed to hepatitis A.  Hepatitis B vaccine. You may need this if you have certain conditions or if you travel or work in places where you may be exposed to hepatitis B.  Haemophilus influenzae type b (Hib) vaccine. You may need this if you have certain conditions. Talk to your health care provider about which screenings and vaccines you need and how often you need them. This information is not intended to replace advice given to you by your health care provider. Make sure you discuss any questions you have with your health care provider. Document Released: 12/08/2015 Document Revised: 07/31/2016 Document Reviewed: 09/12/2015 Elsevier Interactive Patient Education  2017 [place] 18-39 Years, Female Preventive care refers to lifestyle choices and visits with your health care provider that can promote health and wellness. What does preventive care include?  A yearly physical exam. This is also called an annual well check.  Dental exams once or twice a year.  Routine eye exams. Ask your health care provider how often you should have your eyes checked.  Personal lifestyle choices, including:  Daily care of your teeth and gums.  Regular physical activity.  Eating a healthy diet.  Avoiding tobacco and drug use.  Limiting alcohol use.  Practicing safe sex.  Taking vitamin and mineral supplements as recommended by your health care provider. What happens during an annual well check? The services and screenings done by your health care provider during your annual well check will depend on your age, overall health, lifestyle risk factors, and family history of disease. Counseling  Your health care provider may ask you questions about your:  Alcohol use.  Tobacco use.  Drug use.  Emotional well-being.  Home and  relationship well-being.  Sexual activity.  Eating habits.  Work and work Statistician.  Method of birth control.  Menstrual cycle.  Pregnancy history. Screening  You may have the following tests or measurements:  Height, weight, and BMI.  Diabetes screening. This is done by checking your blood sugar (glucose) after you have not eaten for a while (fasting).  Blood pressure.  Lipid and cholesterol levels. These may be checked every 5 years starting at age 64.  Skin check.  Hepatitis C blood test.  Hepatitis B blood test.  Sexually transmitted disease (STD) testing.  BRCA-related cancer screening. This may be done if you have a family history of breast, ovarian, tubal, or peritoneal cancers.  Pelvic exam and Pap test. This may be done every 3 years starting at age 22. Starting at age 18, this may be done every 5 years if you have a Pap test in combination with an HPV test. Discuss your test results, treatment options, and if necessary, the need for more tests with your health care provider. Vaccines  Your health care provider may recommend certain vaccines, such as:  Influenza vaccine. This is recommended every year.  Tetanus, diphtheria, and acellular pertussis (Tdap, Td) vaccine. You may need a  Td booster every 10 years.  Varicella vaccine. You may need this if you have not been vaccinated.  HPV vaccine. If you are 4 or younger, you may need three doses over 6 months.  Measles, mumps, and rubella (MMR) vaccine. You may need at least one dose of MMR. You may also need a second dose.  Pneumococcal 13-valent Meningococcal vaccine. One dose is recommended if you are age 47-21 years and a first-year college student living in a residence hall, or if you have one of several medical conditions. You may also need ad

## 2016-10-14 NOTE — Progress Notes (Signed)
Pre visit review using our clinic review tool, if applicable. No additional management support is needed unless otherwise documented below in the visit note. 

## 2016-10-15 LAB — CBC
HCT: 34 % — ABNORMAL LOW (ref 36.0–46.0)
Hemoglobin: 11.7 g/dL — ABNORMAL LOW (ref 12.0–15.0)
MCHC: 34.5 g/dL (ref 30.0–36.0)
MCV: 92.9 fl (ref 78.0–100.0)
PLATELETS: 302 10*3/uL (ref 150.0–400.0)
RBC: 3.66 Mil/uL — AB (ref 3.87–5.11)
RDW: 13.5 % (ref 11.5–15.5)
WBC: 6.3 10*3/uL (ref 4.0–10.5)

## 2016-10-15 LAB — COMPREHENSIVE METABOLIC PANEL
ALBUMIN: 4.1 g/dL (ref 3.5–5.2)
ALK PHOS: 38 U/L — AB (ref 39–117)
ALT: 14 U/L (ref 0–35)
AST: 19 U/L (ref 0–37)
BILIRUBIN TOTAL: 0.2 mg/dL (ref 0.2–1.2)
BUN: 13 mg/dL (ref 6–23)
CALCIUM: 9.6 mg/dL (ref 8.4–10.5)
CHLORIDE: 104 meq/L (ref 96–112)
CO2: 27 mEq/L (ref 19–32)
CREATININE: 0.78 mg/dL (ref 0.40–1.20)
GFR: 85.97 mL/min (ref >60.00)
Glucose, Bld: 81 mg/dL (ref 70–99)
Potassium: 4.4 mEq/L (ref 3.5–5.1)
SODIUM: 137 meq/L (ref 135–145)
TOTAL PROTEIN: 7.3 g/dL (ref 6.0–8.3)

## 2016-10-15 LAB — TSH: TSH: 3.44 u[IU]/mL (ref 0.35–4.50)

## 2016-10-27 NOTE — Assessment & Plan Note (Signed)
hgba1c acceptable, minimize simple carbs. Increase exercise as tolerated.  

## 2016-10-27 NOTE — Assessment & Plan Note (Signed)
Mild, Increase leafy greens, consider increased lean red meat and using cast iron cookware. Continue to monitor, report any concerns

## 2016-10-27 NOTE — Progress Notes (Signed)
Patient ID: Ellen Campbell, female   DOB: Sep 13, 1976, 40 y.o.   MRN: [id number]   Subjective:    Patient ID: Ellen Campbell, female    DOB: 11-Apr-1976, 40 y.o.   MRN: [id number]  Chief Complaint  Patient presents with  . Annual Exam    HPI Patient is in today for annual preventative exam. No recent illness or acute concerns. Follows with gyn for paps. She is trying to stay active and maintain a heart healthy diet. Denies CP/palp/SOB/HA/congestion/fevers/GI or GU c/o. Taking meds as prescribed  Past Medical History:  Diagnosis Date  . Allergic state   . Allergy   . Anemia 09/15/2015  . Chicken pox as a kid  . Enlarged tonsils 08/22/2013  . Hyperglycemia 09/15/2015  . Hyponatremia 08/22/2013  . Lipoma of skin and subcutaneous tissue 09/04/2014  . Preventative health care 08/22/2013    Past Surgical History:  Procedure Laterality Date  . WISDOM TOOTH EXTRACTION  40 yrs old    Family History  Problem Relation Age of Onset  . Thyroid disease Mother   . Cancer Mother     uterine,   . Diabetes Mother     hyperglycemia  . Hypertension Father   . Cancer Father     prostate  . Other Paternal Grandmother     brain tumor  . Diabetes Maternal Aunt   . Cancer Paternal Uncle     prostate ca    Social History   Social History  . Marital status: Married    Spouse name: N/A  . Number of children: N/A  . Years of education: N/A   Occupational History  . Not on file.   Social History Main Topics  . Smoking status: Never Smoker  . Smokeless tobacco: Never Used  . Alcohol use Yes     Comment: 2 glasses of wine daily  . Drug use: No  . Sexual activity: Yes     Comment: lives with husband, works for [employer], no major dietary restrictions   Other Topics Concern  . Not on file   Social History Narrative  . No narrative on file    Outpatient Medications Prior to Visit  Medication Sig Dispense Refill  . cetirizine (ZYRTEC) 10 MG tablet Take 10 mg by mouth daily.      . fluticasone (FLONASE) 50 MCG/ACT nasal spray PLACE 1 SPRAY INTO THE NOSE DAILY. 48 g 3  . norethindrone-ethinyl estradiol (JUNEL FE,GILDESS FE,LOESTRIN FE) 1-20 MG-MCG tablet Take 1 tablet by mouth daily.     No facility-administered medications prior to visit.     Allergies  Allergen Reactions  . Ciprofloxacin     swelling    Review of Systems  Constitutional: Negative for chills, fever and malaise/fatigue.  HENT: Negative for congestion and hearing loss.   Eyes: Negative for discharge.  Respiratory: Negative for cough, sputum production and shortness of breath.   Cardiovascular: Negative for chest pain, palpitations and leg swelling.  Gastrointestinal: Negative for abdominal pain, blood in stool, constipation, diarrhea, heartburn, nausea and vomiting.  Genitourinary: Negative for dysuria, frequency, hematuria and urgency.  Musculoskeletal: Negative for back pain, falls and myalgias.  Skin: Negative for rash.  Neurological: Negative for dizziness, sensory change, loss of consciousness, weakness and headaches.  Endo/Heme/Allergies: Negative for environmental allergies. Does not bruise/bleed easily.  Psychiatric/Behavioral: Negative for depression and suicidal ideas. The patient is not nervous/anxious and does not have insomnia.        Objective:    Physical Exam  Constitutional: She is oriented to person, place, and time. She appears well-developed and well-nourished. No distress.  HENT:  Head: Normocephalic and atraumatic.  Eyes: Conjunctivae are normal.  Neck: Neck supple. No thyromegaly present.  Cardiovascular: Normal rate, regular rhythm and normal heart sounds.   No murmur heard. Pulmonary/Chest: Effort normal and breath sounds normal. No respiratory distress.  Abdominal: Soft. Bowel sounds are normal. She exhibits no distension and no mass. There is no tenderness.  Musculoskeletal: She exhibits no edema.  Lymphadenopathy:    She has no cervical adenopathy.   Neurological: She is alert and oriented to person, place, and time.  Skin: Skin is warm and dry.  Psychiatric: She has a normal mood and affect. Her behavior is normal.    BP 108/70 (BP Location: Left Arm, Patient Position: Sitting, Cuff Size: Normal)   Pulse 83   Temp 98.7 F (37.1 C) (Oral)   Ht 5' 9"  (1.753 m)   Wt 154 lb 4 oz (70 kg)   SpO2 96%   BMI 22.78 kg/m  Wt Readings from Last 3 Encounters:  10/14/16 154 lb 4 oz (70 kg)  09/05/15 150 lb 6 oz (68.2 kg)  08/29/14 149 lb 6.4 oz (67.8 kg)     Lab Results  Component Value Date   WBC 6.3 10/14/2016   HGB 11.7 (L) 10/14/2016   HCT 34.0 (L) 10/14/2016   PLT 302.0 10/14/2016   GLUCOSE 81 10/14/2016   CHOL 162 08/29/2015   TRIG 136.0 08/29/2015   HDL 78.60 08/29/2015   LDLCALC 56 08/29/2015   ALT 14 10/14/2016   AST 19 10/14/2016   NA 137 10/14/2016   K 4.4 10/14/2016   CL 104 10/14/2016   CREATININE 0.78 10/14/2016   BUN 13 10/14/2016   CO2 27 10/14/2016   TSH 3.44 10/14/2016   HGBA1C 5.3 08/29/2014    Lab Results  Component Value Date   TSH 3.44 10/14/2016   Lab Results  Component Value Date   WBC 6.3 10/14/2016   HGB 11.7 (L) 10/14/2016   HCT 34.0 (L) 10/14/2016   MCV 92.9 10/14/2016   PLT 302.0 10/14/2016   Lab Results  Component Value Date   NA 137 10/14/2016   K 4.4 10/14/2016   CO2 27 10/14/2016   GLUCOSE 81 10/14/2016   BUN 13 10/14/2016   CREATININE 0.78 10/14/2016   BILITOT 0.2 10/14/2016   ALKPHOS 38 (L) 10/14/2016   AST 19 10/14/2016   ALT 14 10/14/2016   PROT 7.3 10/14/2016   ALBUMIN 4.1 10/14/2016   CALCIUM 9.6 10/14/2016   GFR 85.97 10/14/2016   Lab Results  Component Value Date   CHOL 162 08/29/2015   Lab Results  Component Value Date   HDL 78.60 08/29/2015   Lab Results  Component Value Date   LDLCALC 56 08/29/2015   Lab Results  Component Value Date   TRIG 136.0 08/29/2015   Lab Results  Component Value Date   CHOLHDL 2 08/29/2015   Lab Results   Component Value Date   HGBA1C 5.3 08/29/2014       Assessment & Plan:   Problem List Items Addressed This Visit    Preventative health care    Patient encouraged to maintain heart healthy diet, regular exercise, adequate sleep. Consider daily probiotics. Take medications as prescribed. Given and reviewed copy of ACP documents from [employer] and encouraged to complete and return. Follows with GYN for paps      Relevant Orders  CBC (Completed)   Comprehensive metabolic panel (Completed)   TSH (Completed)   Anemia - Primary    Mild, Increase leafy greens, consider increased lean red meat and using cast iron cookware. Continue to monitor, report any concerns      Relevant Orders   CBC (Completed)   Comprehensive metabolic panel (Completed)   TSH (Completed)   Hyperglycemia    hgba1c acceptable, minimize simple carbs. Increase exercise as tolerated.        Other Visit Diagnoses    Encounter for immunization       Relevant Orders   Flu Vaccine QUAD 36+ mos IM (Completed)      I am having Ms. Schloesser maintain her norethindrone-ethinyl estradiol, cetirizine, and fluticasone.  No orders of the defined types were placed in this encounter.    Penni Homans, MD

## 2016-10-27 NOTE — Assessment & Plan Note (Signed)
Patient encouraged to maintain heart healthy diet, regular exercise, adequate sleep. Consider daily probiotics. Take medications as prescribed. Given and reviewed copy of ACP documents from [employer] and encouraged to complete and return. Follows with GYN for paps

## 2017-01-03 ENCOUNTER — Other Ambulatory Visit: Payer: Self-pay | Admitting: Obstetrics & Gynecology

## 2017-01-03 DIAGNOSIS — R928 Other abnormal and inconclusive findings on diagnostic imaging of breast: Secondary | ICD-10-CM

## 2017-01-13 ENCOUNTER — Ambulatory Visit
Admission: RE | Admit: 2017-01-13 | Discharge: 2017-01-13 | Disposition: A | Discharge status: Home / Self Care | Payer: Managed Care, Other (non HMO) | Source: Ambulatory Visit | Attending: Obstetrics & Gynecology | Admitting: Obstetrics & Gynecology

## 2017-01-13 DIAGNOSIS — R928 Other abnormal and inconclusive findings on diagnostic imaging of breast: Secondary | ICD-10-CM

## 2017-05-13 ENCOUNTER — Other Ambulatory Visit: Payer: Self-pay | Admitting: Family Medicine

## 2017-05-13 MED ORDER — FLUTICASONE PROPIONATE 50 MCG/ACT NA SUSP: NASAL | 0 refills | Status: DC

## 2017-08-16 ENCOUNTER — Other Ambulatory Visit: Payer: Self-pay | Admitting: Family Medicine

## 2017-09-25 ENCOUNTER — Other Ambulatory Visit: Payer: Self-pay

## 2017-09-25 MED ORDER — FLUTICASONE PROPIONATE 50 MCG/ACT NA SUSP: NASAL | 1 refills | Status: DC

## 2017-10-19 ENCOUNTER — Other Ambulatory Visit: Payer: Self-pay | Admitting: Family Medicine

## 2017-10-21 ENCOUNTER — Encounter: Payer: Managed Care, Other (non HMO) | Admitting: Family Medicine
